# Patient Record
Sex: Female | Born: 1980 | Race: White | Hispanic: No | Marital: Married | Smoking: Never smoker
Health system: Southern US, Community
[De-identification: ages and names within clinical notes are randomized; demographics above are authoritative.]

## PROBLEM LIST (undated history)

## (undated) ENCOUNTER — Inpatient Hospital Stay (HOSPITAL_COMMUNITY): Payer: Self-pay

## (undated) DIAGNOSIS — K219 Gastro-esophageal reflux disease without esophagitis: Secondary | ICD-10-CM

## (undated) DIAGNOSIS — D649 Anemia, unspecified: Secondary | ICD-10-CM

## (undated) DIAGNOSIS — I10 Essential (primary) hypertension: Secondary | ICD-10-CM

## (undated) DIAGNOSIS — E119 Type 2 diabetes mellitus without complications: Secondary | ICD-10-CM

## (undated) DIAGNOSIS — R011 Cardiac murmur, unspecified: Secondary | ICD-10-CM

## (undated) DIAGNOSIS — O24419 Gestational diabetes mellitus in pregnancy, unspecified control: Secondary | ICD-10-CM

## (undated) DIAGNOSIS — O09529 Supervision of elderly multigravida, unspecified trimester: Secondary | ICD-10-CM

## (undated) HISTORY — PX: DILATION AND CURETTAGE OF UTERUS: SHX78

## (undated) HISTORY — DX: Type 2 diabetes mellitus without complications: E11.9

## (undated) HISTORY — DX: Essential (primary) hypertension: I10

## (undated) HISTORY — PX: WISDOM TOOTH EXTRACTION: SHX21

---

## 1998-06-04 HISTORY — PX: FOOT SURGERY: SHX648

## 2001-09-29 ENCOUNTER — Emergency Department (HOSPITAL_COMMUNITY): Admission: EM | Admit: 2001-09-29 | Discharge: 2001-09-29 | Payer: Self-pay | Admitting: Emergency Medicine

## 2007-12-31 ENCOUNTER — Inpatient Hospital Stay (HOSPITAL_COMMUNITY): Admission: AD | Admit: 2007-12-31 | Discharge: 2008-01-02 | Payer: Self-pay | Admitting: Obstetrics and Gynecology

## 2008-01-01 ENCOUNTER — Encounter: Payer: Self-pay | Admitting: Obstetrics and Gynecology

## 2008-01-07 ENCOUNTER — Encounter (HOSPITAL_COMMUNITY): Payer: Self-pay | Admitting: Obstetrics and Gynecology

## 2008-01-07 ENCOUNTER — Inpatient Hospital Stay (HOSPITAL_COMMUNITY): Admission: AD | Admit: 2008-01-07 | Discharge: 2008-01-13 | Payer: Self-pay | Admitting: Obstetrics and Gynecology

## 2008-01-08 ENCOUNTER — Encounter (HOSPITAL_COMMUNITY): Payer: Self-pay | Admitting: Obstetrics and Gynecology

## 2008-01-09 ENCOUNTER — Encounter (HOSPITAL_COMMUNITY): Payer: Self-pay | Admitting: Obstetrics and Gynecology

## 2008-01-12 ENCOUNTER — Encounter (HOSPITAL_COMMUNITY): Payer: Self-pay | Admitting: Obstetrics and Gynecology

## 2008-01-13 ENCOUNTER — Encounter (HOSPITAL_COMMUNITY): Payer: Self-pay | Admitting: Obstetrics and Gynecology

## 2008-01-15 ENCOUNTER — Encounter (INDEPENDENT_AMBULATORY_CARE_PROVIDER_SITE_OTHER): Payer: Self-pay | Admitting: Obstetrics and Gynecology

## 2008-01-15 ENCOUNTER — Encounter: Payer: Self-pay | Admitting: Obstetrics and Gynecology

## 2008-01-15 ENCOUNTER — Inpatient Hospital Stay (HOSPITAL_COMMUNITY): Admission: AD | Admit: 2008-01-15 | Discharge: 2008-01-19 | Payer: Self-pay | Admitting: Obstetrics and Gynecology

## 2008-01-20 ENCOUNTER — Encounter: Admission: RE | Admit: 2008-01-20 | Discharge: 2008-02-19 | Payer: Self-pay | Admitting: Obstetrics and Gynecology

## 2008-02-20 ENCOUNTER — Encounter: Admission: RE | Admit: 2008-02-20 | Discharge: 2008-03-20 | Payer: Self-pay | Admitting: Obstetrics and Gynecology

## 2008-03-21 ENCOUNTER — Encounter: Admission: RE | Admit: 2008-03-21 | Discharge: 2008-03-31 | Payer: Self-pay | Admitting: Obstetrics and Gynecology

## 2010-01-08 IMAGING — US US OB COMP +14 WK
1 series · 14 of 28 positions shown · non-contrast
Comparison: none

OBSTETRICAL ULTRASOUND:
 This ultrasound was performed in The [HOSPITAL], and the [facility] report will be stored to [REDACTED] PACS.

[Series 1: us ob comp +14 wk · 90 acquisitions, 14 frames shown]
[im 4/90]
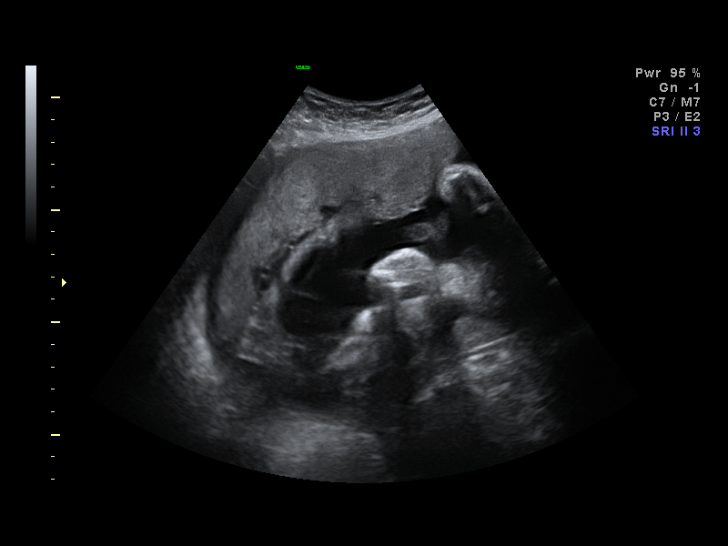
[im 10/90]
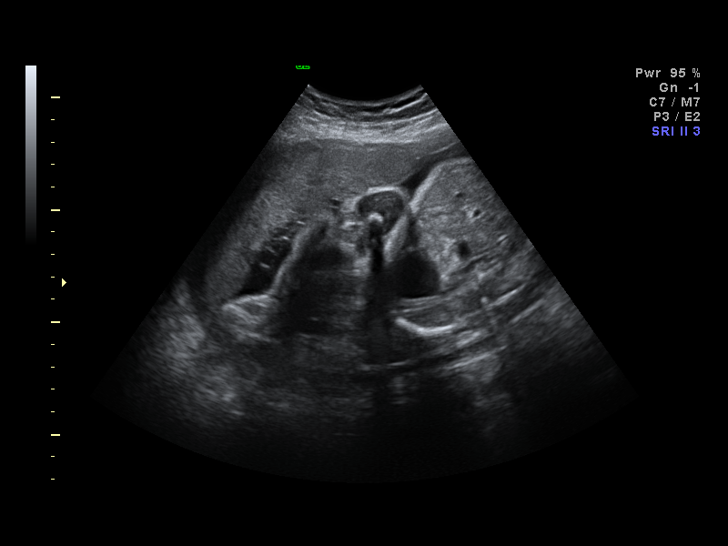
[im 17/90]
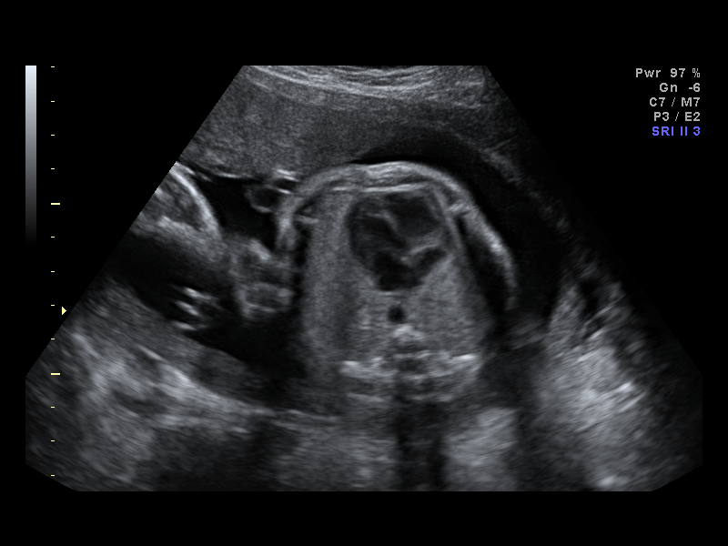
[im 24/90]
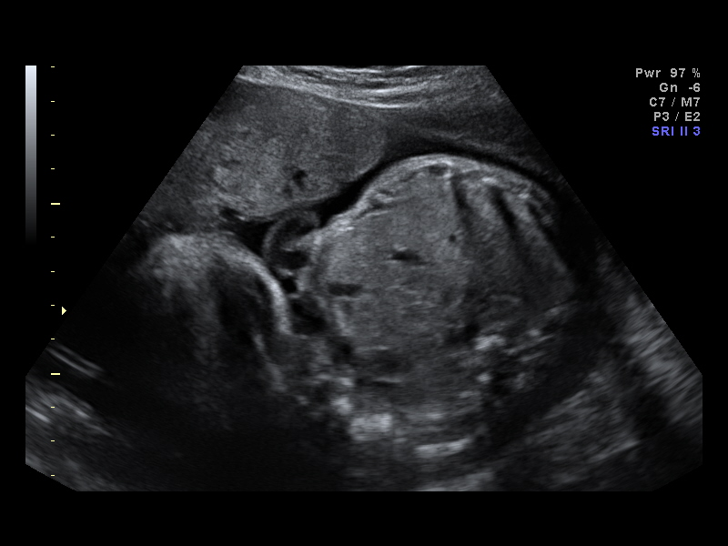
[im 30/90]
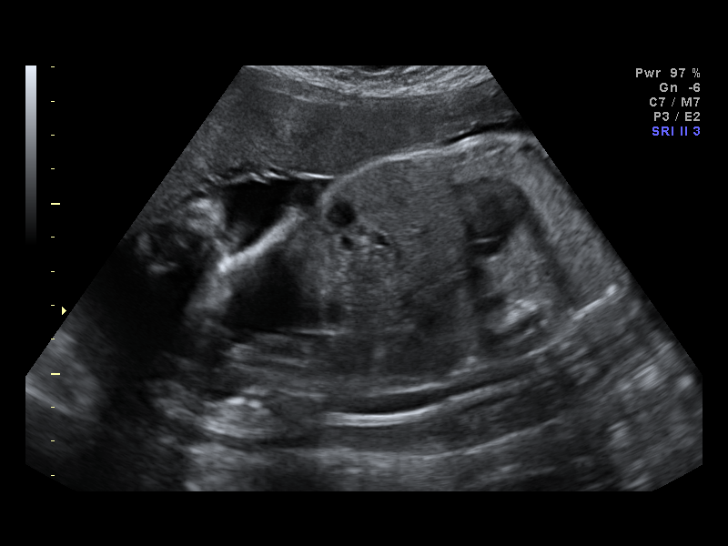
[im 37/90]
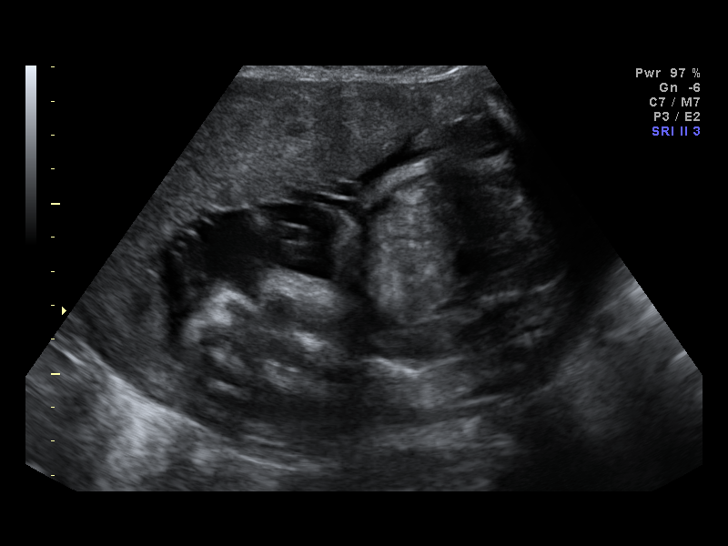
[im 43/90]
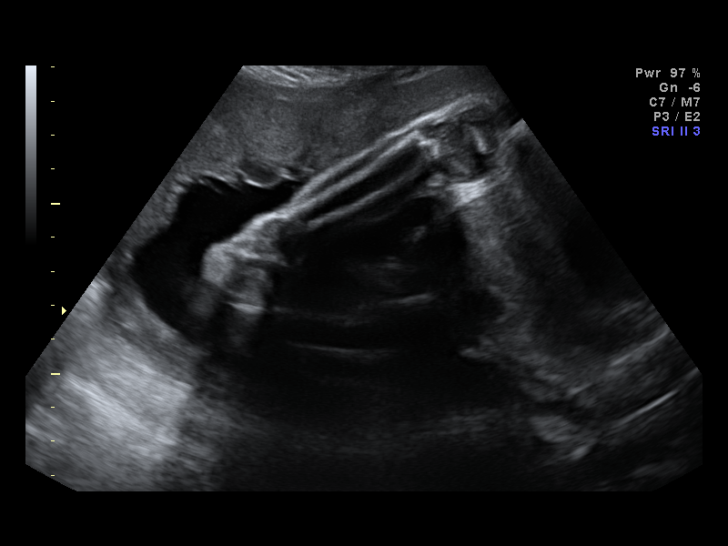
[im 50/90]
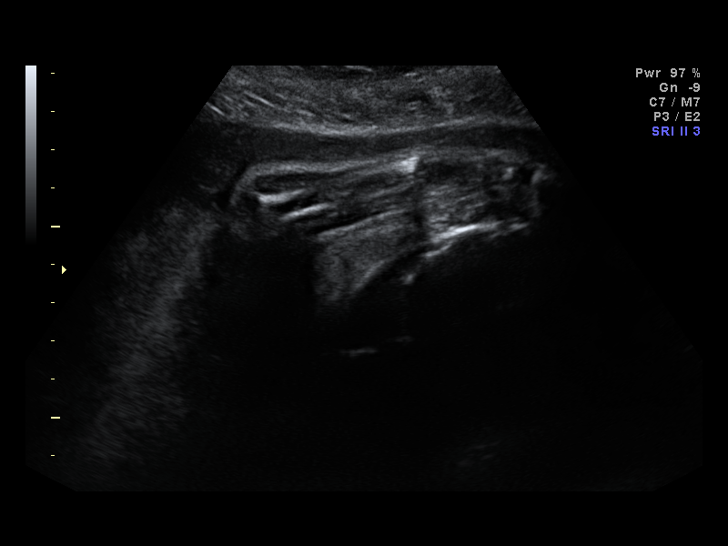
[im 57/90]
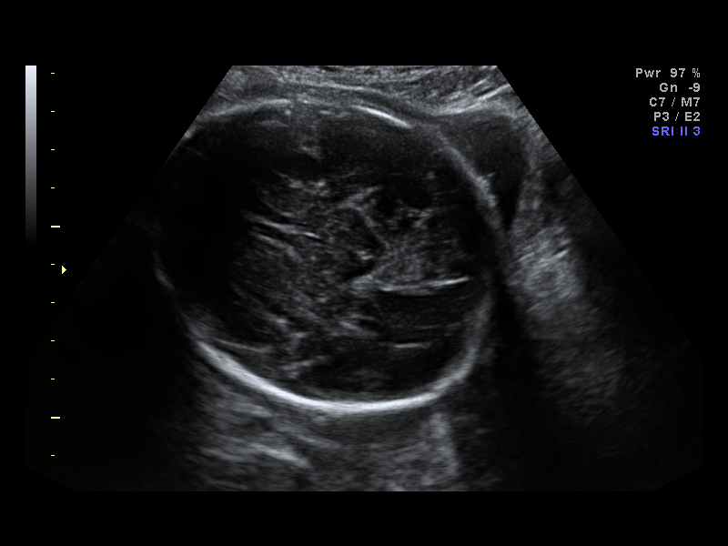
[im 63/90]
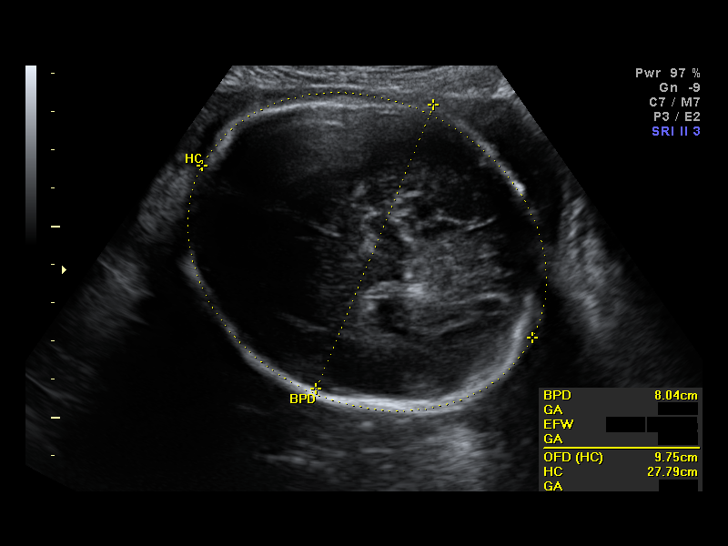
[im 70/90]
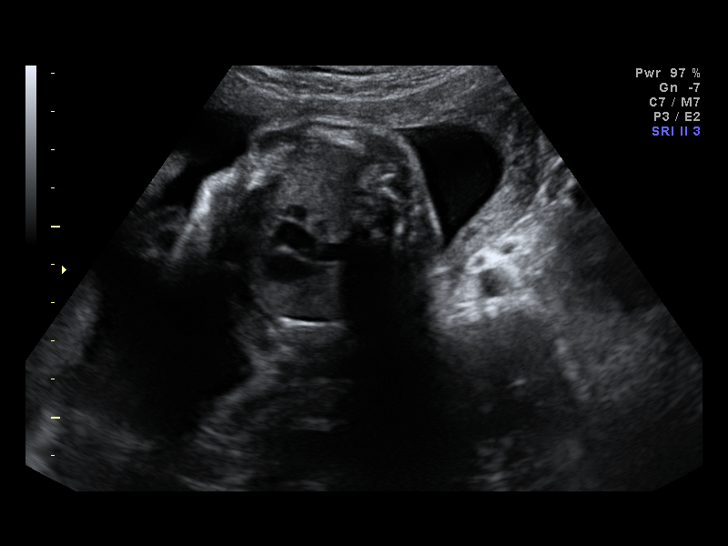
[im 76/90]
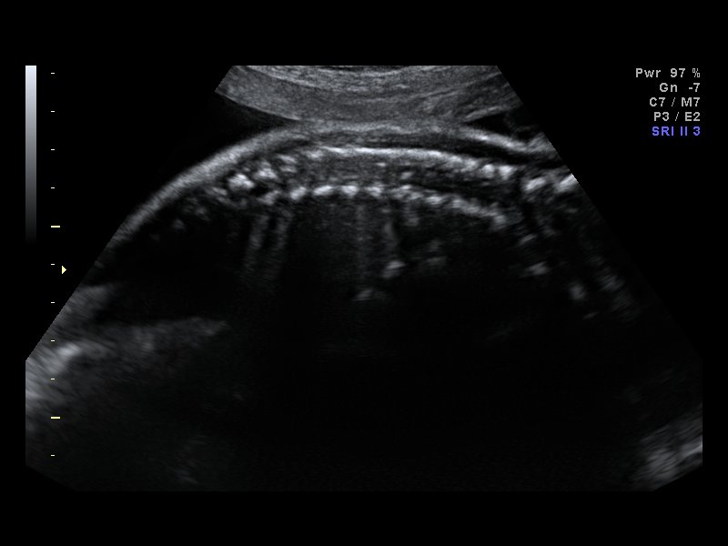
[im 83/90]
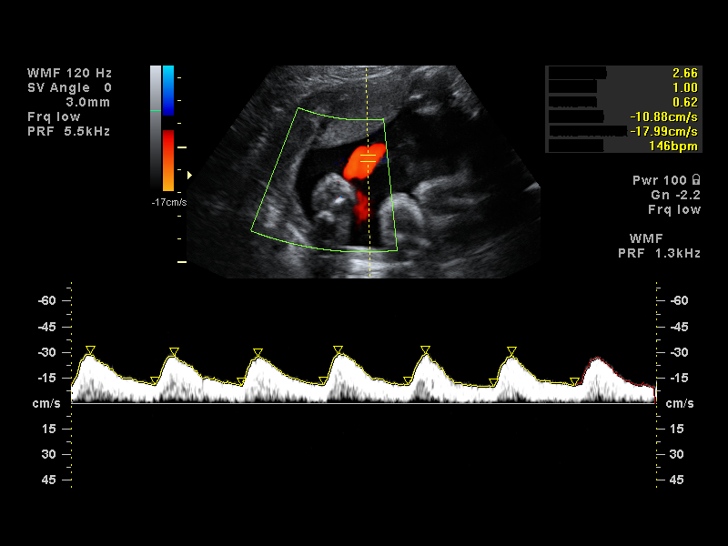
[im 90/90]
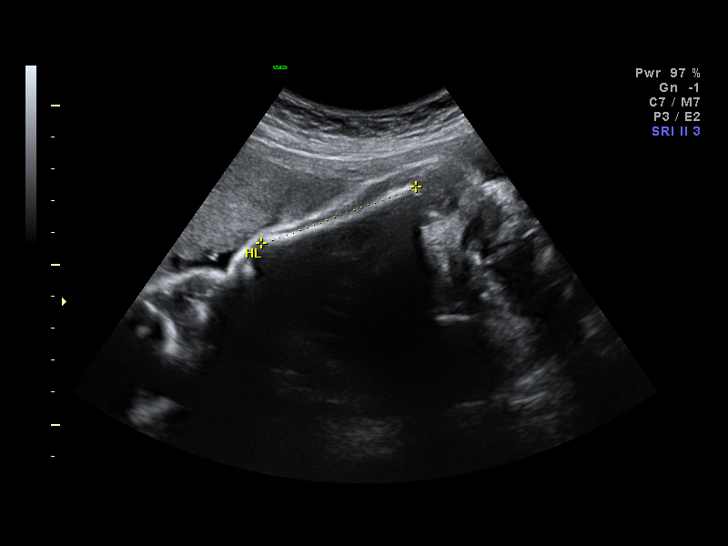

[14 of 28 positions shown; findings below may reference images not displayed]

IMPRESSION: [facility] has also been faxed to the ordering physician.

## 2010-10-17 NOTE — Consult Note (Signed)
NAMESARAE, NICHOLES                ACCOUNT NO.:  [id number]   MEDICAL RECORD NO.:  [id number]          PATIENT TYPE:  OUT   LOCATION:  [facility]FM                           [facility]ILITY:  [facility]   PHYSICIAN:  Toma Copier, MD           DATE OF BIRTH:  1981-03-09   DATE OF CONSULTATION:  01/01/2008  DATE OF DISCHARGE:                                 CONSULTATION   ADDRESS:  Guy Sandifer. Henderson Cloud, M.D.  [address]  [place]  [place], [place] [address]   BODY:  Dear Dr. Henderson Cloud:   Thank you for allowing me to see Anita Hall today for consultation.  As  you know, she is a 30 year old G1 admitted from the office yesterday  after an ultrasound revealed a growth restricted fetus approximately the  seventh percentile.  The patient has been in the hospital since that  time receiving antenatal corticosteroids and undergoing further  evaluation.  She has a past medical history notable for chronic  hypertension which was diagnosed approximately 2 years ago.  She is  otherwise without complaints.  She reports good fetal movement and no  contractions.  No abnormal discharge.  No bleeding.  No headaches,  visual changes or right upper  quadrant pain.   PAST MEDICAL HISTORY:  As mentioned above for chronic hypertension for  approximately 2 years.  No medications required during this pregnancy up  until the beginning of July when she was started on labetalol.  Since  then, this medication has been increased to 200 mg twice a day.   PAST SURGICAL HISTORY:  Notable for foot surgery in the past.   MEDICATIONS:  Labetalol, as mentioned above, and prenatal vitamins.   ALLERGIES:  No known drug allergies.   SOCIAL HISTORY:  The patient denies any tobacco, alcohol or drugs of  abuse.   PAST OB HISTORY:  She is a primigravida.   PAST GYN HISTORY:  The patient denies any abnormal Pap smears or any  STDs.   FAMILY HISTORY:  Noncontributory.   ULTRASOUND FINDINGS:  On ultrasound today, we noticed that the estimated  fetal weight is 1445 grams, which is at the 16th percentile for  gestational age.  The fetus was noted to also have normal amniotic fluid  volume of 15.7 cm and in the vertex presentation.  Local artery Dopplers  were also done given the concern for growth restriction on admission and  are noted to be normal with an S:D ratio at the 69th percentile.  There  is no absent or reverse end diastolic flow noted.  Also, the biophysical  profile was 8/8.   ASSESSMENT AND RECOMMENDATIONS:  Intrauterine pregnancy at 32 weeks and  1 day in a patient with known chronic hypertension.  The patient denies  any symptomatology of severe preeclampsia and states that her blood  pressures were in the mildly elevated range.  I would recommend that she  undergo a 24-hour urine collection and a further evaluation for  preeclampsia.  Given these findings, I would recommend that she complete  her course of antenatal  steroids.  After which point, if her blood  pressures remain stable, she would be a good candidate for outpatient  observation.  The findings on ultrasound revealed that the abdominal  circumference is at the 4th percentile and there may be a component of  asymmetric growth restriction, but is not absolutely growth restricted  at this time.  I would recommend a followup ultrasound evaluation in  approximately 2 to 3 weeks to assess interval growth.  Furthermore,  given her chronic hypertension status and the need for  antihypertensives, I would recommend that she initiate nonstress test  beginning during this hospitalization and upon discharge, and if  discharged, she have this at least twice a week.  These findings were  presented to Dr. Henderson Cloud, who agreed with our plan.  The patient also  was aware of these findings and plan, and all questions were answered.   Thank you very much for allowing me to participate in the care of Ms.  Hall, and if I can be of any further assistance, please feel free  to  contact me.   TOTAL CONSULTATION TIME:  Approximately 40 minutes, of which direct face-  to-face time was greater than 50%.      Toma Copier, MD  Electronically Signed     SJ/MEDQ  D:  01/01/2008  T:  01/01/2008  Job:  [id number]

## 2010-10-17 NOTE — Discharge Summary (Signed)
Anita Hall, Anita Hall                ACCOUNT NO.:  [id number]   MEDICAL RECORD NO.:  [id number]          PATIENT TYPE:  INP   LOCATION:  9305                          [facility]ILITY:  [facility]   PHYSICIAN:  Zelphia Cairo, MD    DATE OF BIRTH:  1981/02/16   DATE OF ADMISSION:  01/15/2008  DATE OF DISCHARGE:  01/19/2008                               DISCHARGE SUMMARY   ADMITTING DIAGNOSES:  1. Intrauterine pregnancy at 34-1/7 weeks estimated gestational age.  2. Chronic hypertension.  3. Intrauterine growth retardation.   DISCHARGE DIAGNOSES:  1. Status post low-transverse cesarean section secondary to non-      reassuring fetal heart tones.  2. Viable female infant.   PROCEDURE:  Low-transverse cesarean section.   REASON FOR ADMISSION:  Please see written H&P.   HOSPITAL COURSE:  The patient is a 30 year old white married female  primigravida who was admitted to [facility] at 34-1/7  weeks estimated gestational age for an induction of labor.  The  patient's pregnancy had been complicated by chronic hypertension and  poor fetal growth.  The patient had been evaluated by Maternal-Fetal  Medicine which had revealed only a 40 g growth of the fetus in  approximately 2 weeks and was also noted to have intermittent lack of  end-diastolic flow, and no reverse end-diastolic flow.  Baby was in the  vertex presentation.  Cervix was noted to be closed and thick.  After  discussion with Dr. Margot Ables, a decision was made to proceed with a two-  stage induction of labor.  Cervidil was placed in the vagina and the  patient did noted to have some mild contractions.  Fetus did respond  with some decelerations with heart rate down into the 50s.  Cervidil was  removed.  Oxygen was administered and IV fluids were given.  Fetal heart  tone did return back to its baseline with good beat-to-beat variability.  However, due to cervix being closed and non-reassuring fetal heart  tones, decision was  made to proceed with a primary low-transverse  cesarean section.  The patient was then transferred to the operating  room where spinal anesthesia was admitted without difficulty.  A low-  transverse incision was made with delivery of a viable female infant with  Apgars of 7 at 1 and 9 at 5 minutes.  Arterial cord pH of 7.27.  The  patient tolerated the procedure well and was taken to the recovery room  in stable condition.  On postoperative day #1, the patient was without  complaint.  Vital signs were stable with blood pressure 120/75.  Abdomen  was soft.  Fundus was firm and nontender.  Abdominal dressing had to be  clean, dry, and intact.  Laboratory findings revealed; hemoglobin of  11.6, platelet count of 151,000,  and WBC count of 13.4.  Liver function  tests were within normal limits.  On postoperative day #2, the patient  was without complaint.  Vital signs remained stable.  Abdomen was soft.  Fundus firm and nontender.  Abdominal dressing had been removed  revealing incision that was clean, dry,  and intact.  On postoperative  day #3, the patient was without complaint.  Vital signs were stable.  She was afebrile.  Fundus firm and nontender.  She is ambulating well,  tolerating a regular diet without complaints of nausea and vomiting.  On  postoperative day #4, the patient was without complaint.  Pain was well  controlled.  She denied any nausea or vomiting.  Vital signs were  stable.  Her blood pressure 124-133 over 82-97.  Hemoglobin was 11.6.  Fundus was firm and nontender.  Incision was clean, dry, and intact.  Staples were removed.  The patient was later discharged home.   CONDITION ON DISCHARGE:  Stable.   DIET:  Regular as tolerated.   ACTIVITY:  No heavy lifting, no driving x2 weeks, and no vaginal entry.   FOLLOWUP:  The patient to follow up in the office in 1 week for an  incision check.  She is to call for temperature greater than 100  degrees, persistent nausea and  vomiting, heavy vaginal bleeding, and/or  redness or drainage of incisional site.   DISCHARGE MEDICATIONS:  1. Percocet 5/325 #30 one p.o. every 4-6 hours p.r.n.  2. Motrin 600 mg every 6 hours.  3. Labetalol 200 mg 1 p.o. b.i.d.      Julio Sicks, N.P.      Zelphia Cairo, MD  Electronically Signed    CC/MEDQ  D:  02/05/2008  T:  02/05/2008  Job:  [id number]

## 2010-10-17 NOTE — Op Note (Signed)
Anita Hall, Anita Hall                ACCOUNT NO.:  [id number]   MEDICAL RECORD NO.:  [id number]          PATIENT TYPE:  INP   LOCATION:  9305                          [facility]   PHYSICIAN:  Guy Sandifer. Henderson Cloud, M.D. DATE OF BIRTH:  04-03-1981   DATE OF PROCEDURE:  01/15/2008  DATE OF DISCHARGE:                               OPERATIVE REPORT   PREOPERATIVE DIAGNOSES:  1. Chronic hypertension.  2. Intrauterine growth retardation.  3. Nonreassuring fetal heart tracing.  4. A 34-1/7 weeks' estimated gestational age.   POSTOPERATIVE DIAGNOSIS:  1. Chronic hypertension.  2. Intrauterine growth retardation.  3. Nonreassuring fetal heart tracing.  4. 34-1/7 weeks' estimated gestational age.   PROCEDURE:  Low-transverse cesarean section.   SURGEON:  Guy Sandifer. Henderson Cloud, MD   ANESTHESIA:  Spinal, Tyrone Apple. Malen Gauze, MD   SPECIMENS:  Placenta to Pathology.   ESTIMATED BLOOD LOSS:  600 mL.   FINDINGS:  Viable female infant, Apgars of 7 and 9 at 1 and 5 minutes  respectively.  Birth weight pending.  Arterial cord pH 7.27.   INDICATIONS AND CONSENT:  This patient is a 30 year old married white  female G1, P0 with an EDC of February 25, 2008, placing her at 34-1/7th  weeks.  Prenatal care has been complicated by chronic hypertension and  poor fetal growth.  Today, followup ultrasound with maternal fetal  medicine revealed only 40 g of growth in 2 weeks as well as intermittent  lack of end diastolic flow.  There was no reverse end diastolic flow.  Baby was vertex.  Cervix was closed and thick.  After discussion with  Dr. Margot Ables, delivery is recommended.  The patient was admitted to labor  and delivery for a two-stage induction.  Cervidil was placed in the  vagina.  The patient had some mild contractions, which resulted in  decelerations with fetal heart rate in the 50s.  The Cervidil was  removed.  Oxygen was given and IV fluids were given.  The fetal heart  rate resuscitated.  Delivery  was recommended.  Cesarean section was  discussed.  All questions were answered and consent is signed on the  chart.   PROCEDURE:  The patient is taken to the operating room where she is  identified.  Spinal anesthetic is placed and she is placed in dorsal  supine position with left lateral wedge.  She is prepped.  Foley  catheter is placed and bladder was drained, and she is draped in a  sterile fashion.  After testing for adequate spinal anesthesia, skin is  entered through a Pfannenstiel incision and dissection is carried out in  layers to the peritoneum.  Peritoneum is incised and extended superiorly  and inferiorly.  Vesicouterine peritoneum is taken down cephalad  laterally.  The bladder flap was developed and bladder blade was placed.  Uterus is incised in low transverse manner and the uterine cavity is  entered bluntly with a hemostat.  The uterine incision was extended  cephalad laterally with fingers.  Artificial rupture of membranes for  clear fluid is carried out.  Baby is then delivered  from the vertex  presentation without difficulty.  Cord is clamped and cut.  The baby is  handed to the waiting pediatrics team.  Placenta is manually delivered  and sent to pathology.  Uterus is clean.  Uterus is closed in two  running locking imbricating layers of 0 Monocryl suture, which achieves  good hemostasis.  Tubes and ovaries normal bilaterally.  Anterior  peritoneum is closed running fashion which is also used to reapproximate  the pyramidalis muscle in the midline.  A single figure-of-eight 2-0  chromic suture is used on the rectus to obtain complete hemostasis.  Anterior rectus fascia is closed in running fashion with 0 PDS looped  suture.  Skin is closed with clips.  All sponge, instrument, and needle  counts are correct.  The patient is transferred to recovery room in  stable condition.      Guy Sandifer Henderson Cloud, M.D.  Electronically Signed     JET/MEDQ  D:  01/15/2008  T:   01/16/2008  Job:  [phone] [id number]

## 2010-10-17 NOTE — H&P (Signed)
[name], [name]                ACCOUNT NO.:  [id number]   MEDICAL RECORD NO.:  [id number]          PATIENT TYPE:  INP   LOCATION:  9176                          [facility]   PHYSICIAN:  Juluis Mire, M.D.   DATE OF BIRTH:  13-Jun-1980   DATE OF ADMISSION:  12/31/2007  DATE OF DISCHARGE:                              HISTORY & PHYSICAL   The patient is a 30 year old primigravida female, last menstrual period  on May 21, 2007, given her estimated date of confinement of  February 25, 2008, and an estimated gestational age of [redacted] weeks.  Her  prenatal course has been complicated by hypertension.  She had been on  hydrochlorothiazide prepregnancy.  She has now been on labetalol for  management of blood pressure.  She was seen in the office today.  Ultrasound revealed estimated fetal weight of 2 pounds and 13 ounces  with the 6th percentile consistent with symmetrical IUGR.  Amniotic  fluid was 13 cm, which is at 37th percentile.  Umbilical artery Dopplers  were normal.  She did have a nonstress test, which did appear reactive.  In view of the IUGR and in view of the hypertension, the patient is  going to be admitted to receive steroids and further monitoring and  management.  We will probably repeat an ultrasound in the next few days  to reassess the Dopplers and do a biophysical profile.   ALLERGIES:  No known drug allergies.   MEDICATIONS:  She is on labetalol 100 mg twice a day as well as prenatal  vitamins.   PAST MEDICAL HISTORY, FAMILY HISTORY, AND SOCIAL HISTORY:  Please see  prenatal records.   REVIEW OF SYSTEMS:  Noncontributory.   PHYSICAL EXAMINATION:  GENERAL:  The patient is afebrile.  VITAL SIGNS:  Blood pressure is 140/88, other vital signs are stable.  LUNGS:  Clear.  CARDIOVASCULAR:  Regular rhythm and rate, and grade 2/6 systolic  ejection murmur.  No clicks or gallop.  ABDOMEN:  Gravid uterus consistent with dates.  PELVIC:  Not performed.  EXTREMITIES:  Trace edema.  NEUROLOGICAL:  Grossly within normal limits.  Deep tendon reflexes are  2+ and no clonus.   IMPRESSION:  1. Intrauterine pregnancy at 32 weeks with chronic hypertension.  2. Intrauterine growth retardation.   PLAN:  The patient will be brought in for a receiving steroid.  We will  do further monitoring and blood work including PIH panel.  We will  probably repeat the ultrasound with Dopplers in the next day or so.      Juluis Mire, M.D.  Electronically Signed     JSM/MEDQ  D:  12/31/2007  T:  01/01/2008  Job:  [id number]

## 2010-10-17 NOTE — H&P (Signed)
NAMEJONNIE, KUBLY                ACCOUNT NO.:  [id number]   MEDICAL RECORD NO.:  [id number]          PATIENT TYPE:  INP   LOCATION:  9156                          [facility]   PHYSICIAN:  Zelphia Cairo, MD    DATE OF BIRTH:  1981-01-12   DATE OF ADMISSION:  01/07/2008  DATE OF DISCHARGE:                              HISTORY & PHYSICAL   Lasundra is a 30 year old G1, P0 at [redacted] weeks gestation who presented to the  office for a followup NST.  Khori has chronic hypertension and her blood  pressures have been 130-140 over 88-90.  She takes labetalol 200 mg  b.i.d.  She was admitted last week after an ultrasound in our office  showed an estimated fetal weight of 1266 grams which by our charts was  the 6th percentile.  Umbilical artery Dopplers and AFI were normal.  The  patient was admitted over the weekend, given betamethasone.  A followup  ultrasound done in the hospital during the maternal fetal medicine  consult showed estimated fetal weight at the 20th percentile with normal  AFI and normal Dopplers.  Plan was made to start a 24 hour urine and  discharge home if this was normal.  The 24 hour urine returned normal  and the patient was discharged home to follow up in our office with  twice weekly NSTs and weekly Dopplers and AFIs.  The patient presented  today for NST.  NST was flat.  BPP was noted to be 8/8, AFI was 12.1 (33  percentile).  Umbilical cord Doppler showed no end diastolic flow.  MCA  Doppler showed a pulsatile index of 1.12.   Bonna denies any complaints of headaches, visual changes or nausea or  vomiting.  She denies any contractions, vaginal bleeding or leaking of  fluid.   PAST MEDICAL HISTORY:  Chronic hypertension.   PAST SURGICAL HISTORY:  Foot surgery, wisdom teeth extraction.   ALLERGIES:  None.   MEDICATIONS:  1. Labetalol 200 mg b.i.d.  2. Prenatal vitamin daily.   GYN HISTORY:  Is negative for abnormal Pap smears.  No cervical  procedures.  No  history of sexually transmitted infections.   FAMILY HISTORY:  Significant for diabetes and hypertension.   PHYSICAL EXAM:  VITAL SIGNS:  Blood pressure 138/90, weight 145.  GENERAL:  She is in no acute distress.  ABDOMEN:  Abdomen is gravid and nontender.  Cervix is closed, thick and  high, soft.  EXTREMITIES:  Without clubbing, cyanosis or edema.  NEUROLOGICAL:  Exam is grossly intact.  Deep tendon reflexes 2+  bilaterally.  No clonus.   ASSESSMENT AND PLAN:  A 30 year old G1, P0 with questionable IUGR and no  end diastolic flow by umbilical cord Dopplers.  I recommended admission  to the antenatal unit for continuous fetal monitoring.  We will also  repeat  preeclampsia labs and start a 24 urine.  The case was discussed with Dr.  Katrinka Blazing, who recommended daily Dopplers and continuous monitoring.  She  will consult on Ms. Hesler for any further recommendations.  We will  continue her labetalol twice  daily for blood pressure control.      Zelphia Cairo, MD  Electronically Signed     GA/MEDQ  D:  01/07/2008  T:  01/07/2008  Job:  [id number]

## 2011-03-02 LAB — COMPREHENSIVE METABOLIC PANEL
ALT: 36 — ABNORMAL HIGH
AST: 35
AST: 33
AST: 35
Albumin: 3.1 — ABNORMAL LOW
Albumin: 2.7 — ABNORMAL LOW
Albumin: 3.1 — ABNORMAL LOW
Alkaline Phosphatase: 70
Alkaline Phosphatase: 83
BUN: 9
BUN: 13
CO2: 23
CO2: 23
CO2: 26
Calcium: 9.4
Calcium: 9.7
Chloride: 103
Chloride: 105
Creatinine, Ser: 0.5
Creatinine, Ser: 0.52
GFR calc Af Amer: >60
GFR calc non Af Amer: >60
GFR calc non Af Amer: >60
GFR calc non Af Amer: >60
Glucose, Bld: 80
Potassium: 3.8
Potassium: 3.6
Sodium: 136
Total Bilirubin: 0.1 — ABNORMAL LOW
Total Bilirubin: 0.3
Total Protein: 6.9

## 2011-03-02 LAB — LACTATE DEHYDROGENASE
LDH: 148
LDH: 143

## 2011-03-02 LAB — CBC
Hemoglobin: 12.2
Hemoglobin: 12.4
MCHC: 34.1
MCHC: 33.8
MCHC: 34
MCV: 96.9
MCV: 97
MCV: 97.3
Platelets: 192
Platelets: 151
Platelets: 168
RBC: 3.68 — ABNORMAL LOW
RBC: 3.68 — ABNORMAL LOW
RBC: 3.76 — ABNORMAL LOW
RDW: 12.5
RDW: 12.7
RDW: 12.9
WBC: 13 — ABNORMAL HIGH
WBC: 19.1 — ABNORMAL HIGH

## 2011-03-02 LAB — LSPG (L/S RATIO WITH PG)-AMNIO FLUID

## 2011-03-02 LAB — PROTEIN, URINE, 24 HOUR
Collection Interval-UPROT: 24
Protein, 24H Urine: 140 — ABNORMAL HIGH
Protein, Urine: 7
Urine Total Volume-UPROT: 1750

## 2011-03-02 LAB — CREATININE CLEARANCE, URINE, 24 HOUR
Collection Interval-CRCL: 24
Urine Total Volume-CRCL: 1750

## 2011-03-02 LAB — STREP B DNA PROBE

## 2011-03-02 LAB — URIC ACID
Uric Acid, Serum: 5.7
Uric Acid, Serum: 5.5
Uric Acid, Serum: 6.1

## 2012-01-25 LAB — OB RESULTS CONSOLE HIV ANTIBODY (ROUTINE TESTING): HIV: NONREACTIVE

## 2012-01-25 LAB — OB RESULTS CONSOLE HEPATITIS B SURFACE ANTIGEN: Hepatitis B Surface Ag: NEGATIVE

## 2012-01-25 LAB — OB RESULTS CONSOLE RPR: RPR: NONREACTIVE

## 2012-01-25 LAB — OB RESULTS CONSOLE ABO/RH: RH Type: POSITIVE

## 2012-06-25 ENCOUNTER — Encounter: Payer: 59 | Attending: Obstetrics and Gynecology | Admitting: *Deleted

## 2012-06-25 ENCOUNTER — Encounter: Payer: Self-pay | Admitting: *Deleted

## 2012-06-25 DIAGNOSIS — O99810 Abnormal glucose complicating pregnancy: Secondary | ICD-10-CM | POA: Insufficient documentation

## 2012-06-25 DIAGNOSIS — Z713 Dietary counseling and surveillance: Secondary | ICD-10-CM | POA: Insufficient documentation

## 2012-06-25 NOTE — Patient Instructions (Signed)
Goals:  Check glucose levels per MD as instructed  Follow Gestational Diabetes Diet as instructed  Call for follow-up as needed    

## 2012-06-25 NOTE — Progress Notes (Signed)
  Patient was seen on 06/25/2012 for Gestational Diabetes self-management class at the [facility]. The following learning objectives were met by the patient during this course:   States the definition of Gestational Diabetes  States why dietary management is important in controlling blood glucose  Describes the effects each nutrient has on blood glucose levels  Demonstrates ability to create a balanced meal plan  Demonstrates carbohydrate counting   States when to check blood glucose levels  Demonstrates proper blood glucose monitoring techniques  States the effect of stress and exercise on blood glucose levels  States the importance of limiting caffeine and abstaining from alcohol and smoking  Blood glucose monitor given: Blood glucose monitor given: Contour Next EZ Blood Glucose Kit Lot # [id number] Exp: 07/2013 Blood glucose reading: 85 mg/dl  Patient instructed to monitor glucose levels: FBS: 60 - <90 2 hour: <120  *Patient received handouts:  Nutrition Diabetes and Pregnancy  Carbohydrate Counting List  Patient will be seen for follow-up as needed.

## 2012-08-01 ENCOUNTER — Inpatient Hospital Stay (HOSPITAL_COMMUNITY): Payer: 59

## 2012-08-01 ENCOUNTER — Inpatient Hospital Stay (HOSPITAL_COMMUNITY)
Admission: AD | Admit: 2012-08-01 | Discharge: 2012-08-01 | Disposition: A | Discharge status: Home / Self Care | Payer: 59 | Source: Ambulatory Visit | Attending: Obstetrics and Gynecology | Admitting: Obstetrics and Gynecology

## 2012-08-01 ENCOUNTER — Encounter (HOSPITAL_COMMUNITY): Payer: Self-pay | Admitting: *Deleted

## 2012-08-01 DIAGNOSIS — O99891 Other specified diseases and conditions complicating pregnancy: Secondary | ICD-10-CM | POA: Insufficient documentation

## 2012-08-01 DIAGNOSIS — R030 Elevated blood-pressure reading, without diagnosis of hypertension: Secondary | ICD-10-CM | POA: Insufficient documentation

## 2012-08-01 LAB — URIC ACID: Uric Acid, Serum: 4.8 mg/dL (ref 2.4–7.0)

## 2012-08-01 LAB — CBC
MCH: 30.8 pg (ref 26.0–34.0)
Platelets: 271 10*3/uL (ref 150–400)
RBC: 3.96 MIL/uL (ref 3.87–5.11)

## 2012-08-01 LAB — COMPREHENSIVE METABOLIC PANEL
Albumin: 3.1 g/dL — ABNORMAL LOW (ref 3.5–5.2)
Alkaline Phosphatase: 147 U/L — ABNORMAL HIGH (ref 39–117)
BUN: 8 mg/dL (ref 6–23)
Creatinine, Ser: 0.57 mg/dL (ref 0.50–1.10)
Potassium: 3.6 mEq/L (ref 3.5–5.1)
Total Protein: 7.3 g/dL (ref 6.0–8.3)

## 2012-08-01 LAB — LACTATE DEHYDROGENASE: LDH: 191 U/L (ref 94–250)

## 2012-08-01 MED ORDER — ACETAMINOPHEN 500 MG PO TABS
1000.0000 mg | ORAL_TABLET | Freq: Once | ORAL | Status: AC
  Administered 2012-08-01: 1000 mg via ORAL
  Filled 2012-08-01: qty 2

## 2012-08-01 NOTE — MAU Note (Signed)
Sent from office for eval.  BP has been going up, but today is the highest it has been.  Had a headache earlier, comes and goes past 2 days.  Denies epigastric pain, visual changes or increase in swelling.

## 2012-08-01 NOTE — MAU Note (Signed)
Returned from [place], BPP 5/8, 2 off for lack of breathing movements.

## 2012-08-11 ENCOUNTER — Encounter (HOSPITAL_COMMUNITY): Payer: Self-pay | Admitting: *Deleted

## 2012-08-11 ENCOUNTER — Inpatient Hospital Stay (HOSPITAL_COMMUNITY)
Admission: AD | Admit: 2012-08-11 | Discharge: 2012-08-11 | Disposition: A | Discharge status: Home / Self Care | Payer: 59 | Source: Ambulatory Visit | Attending: Obstetrics and Gynecology | Admitting: Obstetrics and Gynecology

## 2012-08-11 ENCOUNTER — Encounter (HOSPITAL_COMMUNITY): Payer: Self-pay

## 2012-08-11 DIAGNOSIS — O10913 Unspecified pre-existing hypertension complicating pregnancy, third trimester: Secondary | ICD-10-CM

## 2012-08-11 DIAGNOSIS — O1002 Pre-existing essential hypertension complicating childbirth: Secondary | ICD-10-CM

## 2012-08-11 DIAGNOSIS — O26893 Other specified pregnancy related conditions, third trimester: Secondary | ICD-10-CM

## 2012-08-11 DIAGNOSIS — O10019 Pre-existing essential hypertension complicating pregnancy, unspecified trimester: Secondary | ICD-10-CM | POA: Insufficient documentation

## 2012-08-11 DIAGNOSIS — R51 Headache: Secondary | ICD-10-CM | POA: Insufficient documentation

## 2012-08-11 LAB — CBC
Platelets: 264 10*3/uL (ref 150–400)
RBC: 3.95 MIL/uL (ref 3.87–5.11)
WBC: 13.5 10*3/uL — ABNORMAL HIGH (ref 4.0–10.5)

## 2012-08-11 LAB — COMPREHENSIVE METABOLIC PANEL
ALT: 26 U/L (ref 0–35)
AST: 27 U/L (ref 0–37)
Alkaline Phosphatase: 162 U/L — ABNORMAL HIGH (ref 39–117)
CO2: 22 mEq/L (ref 19–32)
Chloride: 99 mEq/L (ref 96–112)
GFR calc non Af Amer: >90 mL/min (ref >90)
Sodium: 133 mEq/L — ABNORMAL LOW (ref 135–145)
Total Bilirubin: 0.2 mg/dL — ABNORMAL LOW (ref 0.3–1.2)

## 2012-08-11 LAB — PROTEIN / CREATININE RATIO, URINE
Creatinine, Urine: 40.23 mg/dL
Total Protein, Urine: 6.8 mg/dL

## 2012-08-11 LAB — URINALYSIS, ROUTINE W REFLEX MICROSCOPIC
Glucose, UA: NEGATIVE mg/dL
Hgb urine dipstick: NEGATIVE
Ketones, ur: NEGATIVE mg/dL
Protein, ur: NEGATIVE mg/dL

## 2012-08-11 MED ORDER — OXYCODONE-ACETAMINOPHEN 5-325 MG PO TABS
1.0000 | ORAL_TABLET | ORAL | Status: AC
  Administered 2012-08-11: 1 via ORAL
  Filled 2012-08-11: qty 1

## 2012-08-11 NOTE — MAU Provider Note (Signed)
Chief Complaint:  Hypertension   First Provider Initiated Contact with Patient 08/11/12 1833      HPI: Anita Hall is a 32 y.o. G2P0101 at [redacted]w[redacted]d who presents to maternity admissions sent from office with elevated BP and h/a.  She reports onset of h/a today, described as sharp and constant, and she has not taken anything for the h/a.  She denies epigastric pain or visual disturbances.  She reports less fetal movement in last 2 days than usual but has felt movement in MAU, denies LOF, vaginal bleeding, vaginal itching/burning, urinary symptoms, dizziness, n/v, or fever/chills.     Past Medical History: Past Medical History  Diagnosis Date  . Hypertension   . Diabetes mellitus without complication     Past obstetric history: OB History   Grav Para Term Preterm Abortions TAB SAB Ect Mult Living   2 1  1      1      # Outc Date GA Lbr Len/2nd Wgt Sex Del Anes PTL Lv   1 PRE            2 CUR               Past Surgical History: Past Surgical History  Procedure Laterality Date  . Cesarean section    . Foot surgery  2000  . Wisdom tooth extraction      Family History: Family History  Problem Relation Age of Onset  . Hypertension Maternal Grandmother   . Diabetes Maternal Grandmother   . Cancer Maternal Grandmother   . Hypertension Maternal Grandfather   . Heart disease Maternal Grandfather   . Hypertension Paternal Grandmother     Social History: History  Substance Use Topics  . Smoking status: Never Smoker   . Smokeless tobacco: Not on file  . Alcohol Use: No     Comment: not while pregnant    Allergies: No Known Allergies  Meds:  Prescriptions prior to admission  Medication Sig Dispense Refill  . acetaminophen (TYLENOL) 500 MG tablet Take 1,000 mg by mouth every 6 (six) hours as needed for pain (For headahce.).      Marland Kitchen glyBURIDE (DIABETA) 2.5 MG tablet Take 2.5 mg by mouth at bedtime.      Marland Kitchen labetalol (NORMODYNE) 100 MG tablet Take 100 mg by mouth 2 (two) times  daily.      . Prenatal Vit-Fe Fumarate-FA (PRENATAL MULTIVITAMIN) TABS Take 1 tablet by mouth daily at 12 noon.      . ranitidine (ZANTAC) 75 MG tablet Take 150 mg by mouth at bedtime as needed.         ROS: Pertinent findings in history of present illness.  Physical Exam  Blood pressure 143/85, pulse 84, temperature 98.2 F (36.8 C), temperature source Oral, resp. rate 16, height 5' (1.524 m), weight 72.666 kg (160 lb 3.2 oz), SpO2 100.00%. Patient Vitals for the past 24 hrs:  BP Temp Temp src Pulse Resp SpO2 Height Weight  08/11/12 2000 140/91 mmHg - - - - - - -  08/11/12 1937 131/84 mmHg - - - - - - -  08/11/12 1914 141/89 mmHg - - 91 - - - -  08/11/12 1909 125/86 mmHg - - 98 - - - -  08/11/12 1811 143/85 mmHg - - 84 16 - - -  08/11/12 1740 144/99 mmHg 98.2 F (36.8 C) Oral 92 16 100 % 5' (1.524 m) 72.666 kg (160 lb 3.2 oz)   GENERAL: Well-developed, well-nourished female in no acute  distress.  HEENT: normocephalic HEART: normal rate RESP: normal effort ABDOMEN: Soft, non-tender, gravid appropriate for gestational age EXTREMITIES: Nontender, no edema NEURO: alert and oriented SPECULUM EXAM: Deferred     FHT:  Baseline 145, moderate variability, accelerations present, no decelerations Contractions: q 4-5 mins   Labs: Results for orders placed during the hospital encounter of 08/11/12 (from the past 24 hour(s))  URINALYSIS, ROUTINE W REFLEX MICROSCOPIC     Status: None   Collection Time    08/11/12  5:45 PM      Result Value Range   Color, Urine YELLOW  YELLOW   APPearance CLEAR  CLEAR   Specific Gravity, Urine 1.015  1.005 - 1.030   pH 6.5  5.0 - 8.0   Glucose, UA NEGATIVE  NEGATIVE mg/dL   Hgb urine dipstick NEGATIVE  NEGATIVE   Bilirubin Urine NEGATIVE  NEGATIVE   Ketones, ur NEGATIVE  NEGATIVE mg/dL   Protein, ur NEGATIVE  NEGATIVE mg/dL   Urobilinogen, UA 0.2  0.0 - 1.0 mg/dL   Nitrite NEGATIVE  NEGATIVE   Leukocytes, UA NEGATIVE  NEGATIVE  CBC     Status:  Abnormal   Collection Time    08/11/12  5:55 PM      Result Value Range   WBC 13.5 (*) 4.0 - 10.5 K/uL   RBC 3.95  3.87 - 5.11 MIL/uL   Hemoglobin 12.2  12.0 - 15.0 g/dL   HCT 16.1  09.6 - 04.5 %   MCV 91.1  78.0 - 100.0 fL   MCH 30.9  26.0 - 34.0 pg   MCHC 33.9  30.0 - 36.0 g/dL   RDW 40.9  81.1 - 91.4 %   Platelets 264  150 - 400 K/uL  COMPREHENSIVE METABOLIC PANEL     Status: Abnormal   Collection Time    08/11/12  5:55 PM      Result Value Range   Sodium 133 (*) 135 - 145 mEq/L   Potassium 3.5  3.5 - 5.1 mEq/L   Chloride 99  96 - 112 mEq/L   CO2 22  19 - 32 mEq/L   Glucose, Bld 81  70 - 99 mg/dL   BUN 13  6 - 23 mg/dL   Creatinine, Ser 7.82  0.50 - 1.10 mg/dL   Calcium 9.9  8.4 - 95.6 mg/dL   Total Protein 7.4  6.0 - 8.3 g/dL   Albumin 3.1 (*) 3.5 - 5.2 g/dL   AST 27  0 - 37 U/L   ALT 26  0 - 35 U/L   Alkaline Phosphatase 162 (*) 39 - 117 U/L   Total Bilirubin 0.2 (*) 0.3 - 1.2 mg/dL   GFR calc non Af Amer >90  >90 mL/min   GFR calc Af Amer >90  >90 mL/min  URIC ACID     Status: None   Collection Time    08/11/12  5:55 PM      Result Value Range   Uric Acid, Serum 4.8  2.4 - 7.0 mg/dL    Assessment: 1. Chronic hypertension in pregnancy, third trimester   2. Headache in pregnancy, antepartum, third trimester     Plan: Called Dr Marcelle Overlie to discuss assessment and findings Percocet x1 tab in MAU--pt reports slight improvement in h/a Called Dr Marcelle Overlie to report response to pain medication and continued borderline BPs Percocet x1 additional tab in MAU RN to call Dr Marcelle Overlie in 1 hour with response   Report to [facility], NP at 8:15 pm  Sharen Counter Certified Nurse-Midwife  08/11/2012 6:49 PM Dr. Marcelle Overlie notified that the patient is feeling better and he request she be discharged home to follow up in the office Instructions given to the patient regarding Labor and elevated blood pressure in pregnancy.

## 2012-08-11 NOTE — MAU Note (Signed)
Patient states she was sent from the office today for elevated blood pressure. Patient reports headaches, no contractions, leaking or bleeding and reports  fetal movement but not as much as usual.

## 2012-08-12 ENCOUNTER — Encounter (HOSPITAL_COMMUNITY)
Admission: RE | Admit: 2012-08-12 | Discharge: 2012-08-12 | Disposition: A | Discharge status: Home / Self Care | Payer: 59 | Source: Ambulatory Visit | Attending: Obstetrics and Gynecology | Admitting: Obstetrics and Gynecology

## 2012-08-12 ENCOUNTER — Encounter (HOSPITAL_COMMUNITY): Payer: Self-pay

## 2012-08-12 LAB — TYPE AND SCREEN

## 2012-08-12 LAB — CBC
HCT: 35 % — ABNORMAL LOW (ref 36.0–46.0)
MCHC: 33.4 g/dL (ref 30.0–36.0)
MCV: 91.6 fL (ref 78.0–100.0)
RDW: 13.4 % (ref 11.5–15.5)
WBC: 12.3 10*3/uL — ABNORMAL HIGH (ref 4.0–10.5)

## 2012-08-12 LAB — BASIC METABOLIC PANEL
BUN: 14 mg/dL (ref 6–23)
Chloride: 100 mEq/L (ref 96–112)
Creatinine, Ser: 0.63 mg/dL (ref 0.50–1.10)
GFR calc Af Amer: >90 mL/min (ref >90)

## 2012-08-12 NOTE — Patient Instructions (Addendum)
20 Anita Hall  08/12/2012   Your procedure is scheduled on:  08/13/12  Enter through the Main Entrance of [facility] at 1130 AM.  Pick up the phone at the desk and dial [phone].   Call this number if you have problems the morning of surgery: [phone]   Remember:   Do not eat food:After Midnight.  Do not drink clear liquids: 4 Hours before arrival.  Take these medicines the morning of surgery with A SIP OF WATER: Lobatalol, hold diabetic medicine for 24hrs prior to surgery   Do not wear jewelry, make-up or nail polish.  Do not wear lotions, powders, or perfumes. You may wear deodorant.  Do not shave 48 hours prior to surgery.  Do not bring valuables to the hospital.  Contacts, dentures or bridgework may not be worn into surgery.  Leave suitcase in the car. After surgery it may be brought to your room.  For patients admitted to the hospital, checkout time is 11:00 AM the day of discharge.   Patients discharged the day of surgery will not be allowed to drive home.  Name and phone number of your driver: NA  Special Instructions: Shower using CHG 2 nights before surgery and the night before surgery.  If you shower the day of surgery use CHG.  Use special wash - you have one bottle of CHG for all showers.  You should use approximately 1/3 of the bottle for each shower.   Please read over the following fact sheets that you were given: Surgical Site Infection Prevention

## 2012-08-13 ENCOUNTER — Inpatient Hospital Stay (HOSPITAL_COMMUNITY): Payer: 59 | Admitting: Anesthesiology

## 2012-08-13 ENCOUNTER — Encounter (HOSPITAL_COMMUNITY): Payer: Self-pay | Admitting: Anesthesiology

## 2012-08-13 ENCOUNTER — Inpatient Hospital Stay (HOSPITAL_COMMUNITY)
Admission: AD | Admit: 2012-08-13 | Discharge: 2012-08-16 | DRG: 765 | Disposition: A | Discharge status: Home / Self Care | Payer: 59 | Source: Ambulatory Visit | Attending: Obstetrics and Gynecology | Admitting: Obstetrics and Gynecology

## 2012-08-13 ENCOUNTER — Encounter (HOSPITAL_COMMUNITY)
Admission: AD | Disposition: A | Discharge status: Home / Self Care | Payer: Self-pay | Source: Ambulatory Visit | Attending: Obstetrics and Gynecology

## 2012-08-13 DIAGNOSIS — O24419 Gestational diabetes mellitus in pregnancy, unspecified control: Secondary | ICD-10-CM | POA: Diagnosis present

## 2012-08-13 DIAGNOSIS — O365990 Maternal care for other known or suspected poor fetal growth, unspecified trimester, not applicable or unspecified: Secondary | ICD-10-CM | POA: Diagnosis present

## 2012-08-13 DIAGNOSIS — Z98891 History of uterine scar from previous surgery: Secondary | ICD-10-CM | POA: Clinically undetermined

## 2012-08-13 DIAGNOSIS — O1002 Pre-existing essential hypertension complicating childbirth: Principal | ICD-10-CM | POA: Diagnosis present

## 2012-08-13 DIAGNOSIS — O99814 Abnormal glucose complicating childbirth: Secondary | ICD-10-CM | POA: Diagnosis present

## 2012-08-13 SURGERY — Surgical Case
Anesthesia: Spinal | Site: Abdomen | Wound class: Clean Contaminated

## 2012-08-13 MED ORDER — DIBUCAINE 1 % RE OINT: 1.0000 "application " | TOPICAL_OINTMENT | RECTAL | Status: DC | PRN

## 2012-08-13 MED ORDER — TETANUS-DIPHTH-ACELL PERTUSSIS 5-2.5-18.5 LF-MCG/0.5 IM SUSP
0.5000 mL | Freq: Once | INTRAMUSCULAR | Status: DC
  Filled 2012-08-13: qty 0.5

## 2012-08-13 MED ORDER — BISACODYL 10 MG RE SUPP: 10.0000 mg | Freq: Every day | RECTAL | Status: DC | PRN

## 2012-08-13 MED ORDER — ONDANSETRON HCL 4 MG/2ML IJ SOLN: 4.0000 mg | Freq: Three times a day (TID) | INTRAMUSCULAR | Status: DC | PRN

## 2012-08-13 MED ORDER — MEASLES, MUMPS & RUBELLA VAC ~~LOC~~ INJ: 0.5000 mL | INJECTION | Freq: Once | SUBCUTANEOUS | Status: DC

## 2012-08-13 MED ORDER — PRENATAL MULTIVITAMIN CH
1.0000 | ORAL_TABLET | Freq: Every day | ORAL | Status: DC
  Administered 2012-08-15: 1 via ORAL
  Filled 2012-08-13: qty 1

## 2012-08-13 MED ORDER — FLEET ENEMA 7-19 GM/118ML RE ENEM: 1.0000 | ENEMA | Freq: Every day | RECTAL | Status: DC | PRN

## 2012-08-13 MED ORDER — PHENYLEPHRINE HCL 10 MG/ML IJ SOLN
INTRAMUSCULAR | Status: DC | PRN
  Administered 2012-08-13: 40 ug via INTRAVENOUS
  Administered 2012-08-13: 80 ug via INTRAVENOUS
  Administered 2012-08-13 (×4): 40 ug via INTRAVENOUS

## 2012-08-13 MED ORDER — MEPERIDINE HCL 25 MG/ML IJ SOLN: 6.2500 mg | INTRAMUSCULAR | Status: DC | PRN

## 2012-08-13 MED ORDER — OXYTOCIN 40 UNITS IN LACTATED RINGERS INFUSION - SIMPLE MED
INTRAVENOUS | Status: DC | PRN
  Administered 2012-08-13: 40 [IU] via INTRAVENOUS

## 2012-08-13 MED ORDER — ONDANSETRON HCL 4 MG/2ML IJ SOLN
INTRAMUSCULAR | Status: AC
  Filled 2012-08-13: qty 2

## 2012-08-13 MED ORDER — FENTANYL CITRATE 0.05 MG/ML IJ SOLN
INTRAMUSCULAR | Status: AC
  Filled 2012-08-13: qty 2

## 2012-08-13 MED ORDER — CEFAZOLIN SODIUM-DEXTROSE 2-3 GM-% IV SOLR
2.0000 g | INTRAVENOUS | Status: AC
  Administered 2012-08-13: 2 g via INTRAVENOUS

## 2012-08-13 MED ORDER — OXYTOCIN 10 UNIT/ML IJ SOLN
INTRAMUSCULAR | Status: AC
  Filled 2012-08-13: qty 4

## 2012-08-13 MED ORDER — METOCLOPRAMIDE HCL 5 MG/ML IJ SOLN: 10.0000 mg | Freq: Three times a day (TID) | INTRAMUSCULAR | Status: DC | PRN

## 2012-08-13 MED ORDER — LACTATED RINGERS IV SOLN
INTRAVENOUS | Status: DC
  Administered 2012-08-13 (×3): via INTRAVENOUS

## 2012-08-13 MED ORDER — SCOPOLAMINE 1 MG/3DAYS TD PT72: 1.0000 | MEDICATED_PATCH | Freq: Once | TRANSDERMAL | Status: DC

## 2012-08-13 MED ORDER — IBUPROFEN 800 MG PO TABS
800.0000 mg | ORAL_TABLET | Freq: Three times a day (TID) | ORAL | Status: DC | PRN
  Administered 2012-08-13 – 2012-08-16 (×7): 800 mg via ORAL
  Filled 2012-08-13 (×7): qty 1

## 2012-08-13 MED ORDER — SODIUM CHLORIDE 0.9 % IJ SOLN: 3.0000 mL | Freq: Two times a day (BID) | INTRAMUSCULAR | Status: DC

## 2012-08-13 MED ORDER — OXYTOCIN 40 UNITS IN LACTATED RINGERS INFUSION - SIMPLE MED: 62.5000 mL/h | INTRAVENOUS | Status: AC

## 2012-08-13 MED ORDER — LANOLIN HYDROUS EX OINT: 1.0000 "application " | TOPICAL_OINTMENT | CUTANEOUS | Status: DC | PRN

## 2012-08-13 MED ORDER — SODIUM CHLORIDE 0.9 % IJ SOLN: 3.0000 mL | INTRAMUSCULAR | Status: DC | PRN

## 2012-08-13 MED ORDER — SODIUM CHLORIDE 0.9 % IV SOLN: 250.0000 mL | INTRAVENOUS | Status: DC

## 2012-08-13 MED ORDER — SIMETHICONE 80 MG PO CHEW: 80.0000 mg | CHEWABLE_TABLET | ORAL | Status: DC | PRN

## 2012-08-13 MED ORDER — KETOROLAC TROMETHAMINE 30 MG/ML IJ SOLN: 30.0000 mg | Freq: Four times a day (QID) | INTRAMUSCULAR | Status: AC | PRN

## 2012-08-13 MED ORDER — NALOXONE HCL 1 MG/ML IJ SOLN
1.0000 ug/kg/h | INTRAVENOUS | Status: DC | PRN
  Filled 2012-08-13: qty 2

## 2012-08-13 MED ORDER — KETOROLAC TROMETHAMINE 30 MG/ML IJ SOLN
INTRAMUSCULAR | Status: AC
  Administered 2012-08-13: 30 mg via INTRAVENOUS
  Filled 2012-08-13: qty 1

## 2012-08-13 MED ORDER — ONDANSETRON HCL 4 MG/2ML IJ SOLN
INTRAMUSCULAR | Status: DC | PRN
  Administered 2012-08-13: 4 mg via INTRAVENOUS

## 2012-08-13 MED ORDER — FENTANYL CITRATE 0.05 MG/ML IJ SOLN
INTRAMUSCULAR | Status: AC
  Administered 2012-08-13: 50 ug via INTRAVENOUS
  Filled 2012-08-13: qty 2

## 2012-08-13 MED ORDER — SCOPOLAMINE 1 MG/3DAYS TD PT72
MEDICATED_PATCH | TRANSDERMAL | Status: AC
  Administered 2012-08-13: 1.5 mg via TRANSDERMAL
  Filled 2012-08-13: qty 1

## 2012-08-13 MED ORDER — DIPHENHYDRAMINE HCL 25 MG PO CAPS: 25.0000 mg | ORAL_CAPSULE | Freq: Four times a day (QID) | ORAL | Status: DC | PRN

## 2012-08-13 MED ORDER — 0.9 % SODIUM CHLORIDE (POUR BTL) OPTIME
TOPICAL | Status: DC | PRN
  Administered 2012-08-13: 1000 mL

## 2012-08-13 MED ORDER — DIPHENHYDRAMINE HCL 50 MG/ML IJ SOLN: 25.0000 mg | INTRAMUSCULAR | Status: DC | PRN

## 2012-08-13 MED ORDER — BUPIVACAINE IN DEXTROSE 0.75-8.25 % IT SOLN
INTRATHECAL | Status: DC | PRN
  Administered 2012-08-13: 1.3 mL via INTRATHECAL

## 2012-08-13 MED ORDER — MORPHINE SULFATE 0.5 MG/ML IJ SOLN
INTRAMUSCULAR | Status: AC
  Filled 2012-08-13: qty 10

## 2012-08-13 MED ORDER — SENNOSIDES-DOCUSATE SODIUM 8.6-50 MG PO TABS
2.0000 | ORAL_TABLET | Freq: Every day | ORAL | Status: DC
  Administered 2012-08-13 – 2012-08-15 (×3): 2 via ORAL

## 2012-08-13 MED ORDER — MENTHOL 3 MG MT LOZG: 1.0000 | LOZENGE | OROMUCOSAL | Status: DC | PRN

## 2012-08-13 MED ORDER — NALBUPHINE HCL 10 MG/ML IJ SOLN
5.0000 mg | INTRAMUSCULAR | Status: DC | PRN
  Filled 2012-08-13: qty 1

## 2012-08-13 MED ORDER — KETOROLAC TROMETHAMINE 30 MG/ML IJ SOLN
INTRAMUSCULAR | Status: AC
  Filled 2012-08-13: qty 1

## 2012-08-13 MED ORDER — MORPHINE SULFATE (PF) 0.5 MG/ML IJ SOLN
INTRAMUSCULAR | Status: DC | PRN
  Administered 2012-08-13: .15 mg via INTRATHECAL

## 2012-08-13 MED ORDER — SIMETHICONE 80 MG PO CHEW
80.0000 mg | CHEWABLE_TABLET | Freq: Three times a day (TID) | ORAL | Status: DC
  Administered 2012-08-13 – 2012-08-16 (×9): 80 mg via ORAL

## 2012-08-13 MED ORDER — IBUPROFEN 600 MG PO TABS
600.0000 mg | ORAL_TABLET | Freq: Four times a day (QID) | ORAL | Status: DC | PRN
  Filled 2012-08-13: qty 1

## 2012-08-13 MED ORDER — DIPHENHYDRAMINE HCL 25 MG PO CAPS
25.0000 mg | ORAL_CAPSULE | ORAL | Status: DC | PRN
  Filled 2012-08-13: qty 1

## 2012-08-13 MED ORDER — PHENYLEPHRINE 40 MCG/ML (10ML) SYRINGE FOR IV PUSH (FOR BLOOD PRESSURE SUPPORT)
PREFILLED_SYRINGE | INTRAVENOUS | Status: AC
  Filled 2012-08-13: qty 5

## 2012-08-13 MED ORDER — ZOLPIDEM TARTRATE 5 MG PO TABS: 5.0000 mg | ORAL_TABLET | Freq: Every evening | ORAL | Status: DC | PRN

## 2012-08-13 MED ORDER — WITCH HAZEL-GLYCERIN EX PADS: 1.0000 "application " | MEDICATED_PAD | CUTANEOUS | Status: DC | PRN

## 2012-08-13 MED ORDER — DIPHENHYDRAMINE HCL 50 MG/ML IJ SOLN: 12.5000 mg | INTRAMUSCULAR | Status: DC | PRN

## 2012-08-13 MED ORDER — NALOXONE HCL 0.4 MG/ML IJ SOLN: 0.4000 mg | INTRAMUSCULAR | Status: DC | PRN

## 2012-08-13 MED ORDER — FENTANYL CITRATE 0.05 MG/ML IJ SOLN
INTRAMUSCULAR | Status: DC | PRN
  Administered 2012-08-13: 25 ug via INTRATHECAL

## 2012-08-13 MED ORDER — EPHEDRINE 5 MG/ML INJ
INTRAVENOUS | Status: AC
  Filled 2012-08-13: qty 10

## 2012-08-13 MED ORDER — OXYCODONE-ACETAMINOPHEN 5-325 MG PO TABS
1.0000 | ORAL_TABLET | Freq: Four times a day (QID) | ORAL | Status: DC | PRN
  Administered 2012-08-14 (×7): 1 via ORAL
  Filled 2012-08-13 (×2): qty 1
  Filled 2012-08-13: qty 2
  Filled 2012-08-13 (×5): qty 1

## 2012-08-13 MED ORDER — FENTANYL CITRATE 0.05 MG/ML IJ SOLN
25.0000 ug | INTRAMUSCULAR | Status: DC | PRN
  Administered 2012-08-13: 50 ug via INTRAVENOUS

## 2012-08-13 MED ORDER — LABETALOL HCL 100 MG PO TABS
100.0000 mg | ORAL_TABLET | Freq: Two times a day (BID) | ORAL | Status: DC
  Administered 2012-08-14 – 2012-08-16 (×4): 100 mg via ORAL
  Filled 2012-08-13 (×6): qty 1

## 2012-08-13 SURGICAL SUPPLY — 26 items
CLOTH BEACON ORANGE TIMEOUT ST (SAFETY) ×2 IMPLANT
DRAPE LG THREE QUARTER DISP (DRAPES) ×2 IMPLANT
DRSG OPSITE POSTOP 4X10 (GAUZE/BANDAGES/DRESSINGS) ×2 IMPLANT
DURAPREP 26ML APPLICATOR (WOUND CARE) ×2 IMPLANT
ELECT REM PT RETURN 9FT ADLT (ELECTROSURGICAL) ×2
ELECTRODE REM PT RTRN 9FT ADLT (ELECTROSURGICAL) ×1 IMPLANT
EXTRACTOR VACUUM M CUP 4 TUBE (SUCTIONS) IMPLANT
GAUZE SPONGE 4X4 12PLY STRL LF (GAUZE/BANDAGES/DRESSINGS) ×1 IMPLANT
GLOVE BIO SURGEON STRL SZ7 (GLOVE) ×2 IMPLANT
GOWN STRL REIN XL XLG (GOWN DISPOSABLE) ×4 IMPLANT
KIT ABG SYR 3ML LUER SLIP (SYRINGE) IMPLANT
NDL HYPO 25X5/8 SAFETYGLIDE (NEEDLE) ×1 IMPLANT
NEEDLE HYPO 25X5/8 SAFETYGLIDE (NEEDLE) ×2 IMPLANT
NS IRRIG 1000ML POUR BTL (IV SOLUTION) ×2 IMPLANT
PACK C SECTION WH (CUSTOM PROCEDURE TRAY) ×2 IMPLANT
PAD OB MATERNITY 4.3X12.25 (PERSONAL CARE ITEMS) ×2 IMPLANT
SLEEVE SCD COMPRESS KNEE MED (MISCELLANEOUS) IMPLANT
STRIP CLOSURE SKIN 1/2X4 (GAUZE/BANDAGES/DRESSINGS) ×2 IMPLANT
SUT CHROMIC 0 CTX 36 (SUTURE) ×6 IMPLANT
SUT MON AB 4-0 PS1 27 (SUTURE) ×2 IMPLANT
SUT PDS AB 0 CT1 27 (SUTURE) ×4 IMPLANT
SUT VIC AB 3-0 CT1 27 (SUTURE) ×4
SUT VIC AB 3-0 CT1 TAPERPNT 27 (SUTURE) ×2 IMPLANT
TOWEL OR 17X24 6PK STRL BLUE (TOWEL DISPOSABLE) ×6 IMPLANT
TRAY FOLEY CATH 14FR (SET/KITS/TRAYS/PACK) ×2 IMPLANT
WATER STERILE IRR 1000ML POUR (IV SOLUTION) ×2 IMPLANT

## 2012-08-13 NOTE — H&P (Signed)
Anita [name]  DICTATION # [id number] CSN# [id number]   Meriel Pica, MD 08/13/2012 9:19 AM

## 2012-08-13 NOTE — H&P (Signed)
[name], [name]                ACCOUNT NO.:  [id number]  MEDICAL RECORD NO.:  [id number]  LOCATION:  [facility]                           FACILITY:  [facility]  PHYSICIAN:  Duke Salvia. Marcelle [name], M.D.DATE OF BIRTH:  Apr 05, 1981  DATE OF ADMISSION:  08/12/2012 DATE OF DISCHARGE:  08/12/2012                             HISTORY & PHYSICAL   CHIEF COMPLAINT:  For repeat cesarean section at 37 weeks, SGA, chronic hypertension, gestational diabetes, worsening hypertension.  HISTORY OF PRESENT ILLNESS:  A 32 year old, G2, P1-0-0-1, EDD 4/1, EGA [redacted] weeks.  This patient has been on labetalol 100 mg p.o. b.i.d. since the beginning of her pregnancy with reasonable blood pressure control. She also has gestational diabetes, but has had a good CBG control. Recently on serial ultrasound, has had decreased EFW which has been right at the 10-11 percentile with normal AFI, BPP 8/8.  She was seen 2 days ago with worsening headache that required evaluation in MAU with normal PIH labs, but she did require Percocet for headache with BP 170/100.  Due to worsening hypertension, and fetal growth at 10-11 percentile, decision was made to proceed with RCS at 37 weeks.  PAST MEDICAL HISTORY:  Please see the [facility] form for details.  PHYSICAL EXAMINATION:  VITAL SIGNS:  Temp 98.2, blood pressure 170/100. HEENT:  Unremarkable. NECK:  Supple without masses. LUNGS:  Clear. CARDIOVASCULAR:  Regular rate and rhythm without murmurs, rubs, gallops. BREASTS:  Without masses. GU:  Fundal height was 35 cm.  Fetal heart rate 140, cervix was closed. EXTREMITIES:  Reveal 1+ reflexes, 1+ edema.  IMPRESSION:  A 37 week intrauterine pregnancy, history of well- controlled gestational diabetes mellitus, chronic hypertension on labetalol, worsening hypertension, small for gestational age with estimated fetal weight at the 10th to 11th percentile.  PLAN:  RCS.  Decision to proceed at this point, discussed with the patient, because of  her symptomatic headache and worsening hypertension. She understands and agrees with the plan.  This procedure including risks related to bleeding infection, wound infection, transfusion along with her expected recovery time reviewed, which she understands and accepts.    Anita [name], M.D.    RMH/MEDQ  D:  08/13/2012  T:  08/13/2012  Job:  [id number]

## 2012-08-13 NOTE — Anesthesia Procedure Notes (Signed)
Spinal  Patient location during procedure: OR Start time: 08/13/2012 1:04 PM Staffing Anesthesiologist: FOSTER, MICHAEL A. Performed by: anesthesiologist  Preanesthetic Checklist Completed: patient identified, site marked, surgical consent, pre-op evaluation, timeout performed, IV checked, risks and benefits discussed and monitors and equipment checked Spinal Block Patient position: sitting Prep: site prepped and draped and DuraPrep Patient monitoring: heart rate, cardiac monitor, continuous pulse ox and blood pressure Approach: midline Location: L3-4 Injection technique: single-shot Needle Needle type: Sprotte  Needle gauge: 24 G Needle length: 9 cm Needle insertion depth: 4 cm Assessment Sensory level: T4 Additional Notes Patient tolerated procedure well. Adequate sensory level.

## 2012-08-13 NOTE — Anesthesia Preprocedure Evaluation (Addendum)
Anesthesia Evaluation  Patient identified by MRN, date of birth, ID band Patient awake    Reviewed: Allergy & Precautions, H&P , NPO status , Patient's Chart, lab work & pertinent test results  Airway Mallampati: III TM Distance: >3 FB Neck ROM: Full    Dental no notable dental hx. (+) Teeth Intact   Pulmonary neg pulmonary ROS, neg pneumonia -,  breath sounds clear to auscultation  Pulmonary exam normal       Cardiovascular hypertension, Pt. on medications and Pt. on home beta blockers Rhythm:Regular Rate:Normal     Neuro/Psych negative neurological ROS  negative psych ROS   GI/Hepatic GERD-  Medicated and Controlled,  Endo/Other  diabetes, Well Controlled, Gestational, Oral Hypoglycemic Agents  Renal/GU   negative genitourinary   Musculoskeletal negative musculoskeletal ROS (+)   Abdominal   Peds  Hematology negative hematology ROS (+)   Anesthesia Other Findings   Reproductive/Obstetrics (+) Pregnancy SGA                         Anesthesia Physical Anesthesia Plan  ASA: II  Anesthesia Plan: Spinal   Post-op Pain Management:    Induction:   Airway Management Planned: Natural Airway  Additional Equipment:   Intra-op Plan:   Post-operative Plan:   Informed Consent: I have reviewed the patients History and Physical, chart, labs and discussed the procedure including the risks, benefits and alternatives for the proposed anesthesia with the patient or authorized representative who has indicated his/her understanding and acceptance.   Dental advisory given  Plan Discussed with: CRNA, Anesthesiologist and Surgeon  Anesthesia Plan Comments:         Anesthesia Quick Evaluation

## 2012-08-13 NOTE — Transfer of Care (Signed)
Immediate Anesthesia Transfer of Care Note  Patient: Anita [name]  Procedure(s) Performed: Procedure(s) with comments: CESAREAN SECTION (N/A) - Repeat edc 09/03/12  Patient Location: PACU  Anesthesia Type:Spinal  Level of Consciousness: awake, alert  and oriented  Airway & Oxygen Therapy: Patient Spontanous Breathing  Post-op Assessment: Report given to PACU RN and Post -op Vital signs reviewed and stable  Post vital signs: Reviewed and stable  Complications: No apparent anesthesia complications

## 2012-08-13 NOTE — Anesthesia Postprocedure Evaluation (Signed)
  Anesthesia Post-op Note  Patient: Anita [name]  Procedure(s) Performed: Procedure(s) with comments: CESAREAN SECTION (N/A) - Repeat edc 09/03/12  Patient Location: PACU  Anesthesia Type:Spinal  Level of Consciousness: awake, alert  and oriented  Airway and Oxygen Therapy: Patient Spontanous Breathing  Post-op Pain: none  Post-op Assessment: Post-op Vital signs reviewed, Patient's Cardiovascular Status Stable, Respiratory Function Stable, Patent Airway, No signs of Nausea or vomiting, Pain level controlled, No headache and No backache  Post-op Vital Signs: Reviewed and stable  Complications: No apparent anesthesia complications

## 2012-08-13 NOTE — Op Note (Signed)
Preoperative diagnosis: 1. 37 week IUP 2. Chronic hypertension, worsening hypertension 3. SGA 4. Gestational diabetes  Postoperative diagnosis: Same   Procedure: Repeat low transverse cesarean section  Surgeon: Marcelle Overlie  Anesthesia: Spinal  EBL: 700 cc  Complications: None  Procedure and findings:  The patient taken the operating room after an adequate level of spinal anesthetic was obtained with the patient in left tilt position the abdomen prepped and draped in the usual fashion. Foley catheter positioned draining clear urine. Appropriate timeout for taken at that point. Transverse incision made tibial scar which is well healed, this carried down to the fascia which was incised and extended transversely. Rectus muscles divided in the midline, peritoneum entered superiorly without incident and extended vertically. The vesicouterine serosa was incised and the bladder was bluntly and sharply dissected below, bladder blade repositioned. Transverse incision made in the lower segment it is moderately thin this was extended with blunt dissection clear fluid noted the patient delivered of a healthy female Apgars 9 and 9 clear fluid the infant was suctioned, cord clamped and passed the pediatric team for further care. Cord pH was sent, so was removed manually intact, sent to pathology, uterus exteriorized cavity wiped clean with a laparotomy pack closure obtained the first layer of 0 chromic in a locked fashion followed by an imbricating layer of 0 chromic. This is hemostatic the bladder flap area was intact and hemostatic bilateral tubes and ovaries were normal. Prior to closure sponge denies precast reported as correct x2. Peritoneum closed the running 2-0 Vicryl suture, 2-0 Vicryl interrupted sutures used to reapproximate the rectus muscles in the midline 0 PDS was then used from laterally to midline on either side to close the fascia. Subcutaneous tissue was fairly thin and hemostatic was not closed  separately. 4-0 Monocryl subcuticular closure with a pressure dressing applied she tolerated this well went to recovery room in good condition.  Dictated with [facility]  Richard M. Milana Obey.D.

## 2012-08-14 ENCOUNTER — Encounter (HOSPITAL_COMMUNITY): Payer: Self-pay | Admitting: Obstetrics and Gynecology

## 2012-08-14 LAB — CBC
HCT: 31.7 % — ABNORMAL LOW (ref 36.0–46.0)
Hemoglobin: 10.6 g/dL — ABNORMAL LOW (ref 12.0–15.0)
MCV: 91.9 fL (ref 78.0–100.0)
RDW: 13.4 % (ref 11.5–15.5)
WBC: 12.2 10*3/uL — ABNORMAL HIGH (ref 4.0–10.5)

## 2012-08-14 MED ORDER — OXYCODONE-ACETAMINOPHEN 5-325 MG PO TABS
1.0000 | ORAL_TABLET | ORAL | Status: DC | PRN
  Administered 2012-08-14 – 2012-08-15 (×5): 2 via ORAL
  Administered 2012-08-16 (×2): 1 via ORAL
  Filled 2012-08-14 (×2): qty 2
  Filled 2012-08-14: qty 1
  Filled 2012-08-14 (×2): qty 2
  Filled 2012-08-14: qty 1

## 2012-08-14 NOTE — Anesthesia Postprocedure Evaluation (Signed)
  Anesthesia Post-op Note  Patient: Anita Hall  Procedure(s) Performed: Procedure(s) with comments: CESAREAN SECTION (N/A) - Repeat edc 09/03/12  Patient Location: Mother/Baby  Anesthesia Type:Spinal  Level of Consciousness: awake, alert  and oriented  Airway and Oxygen Therapy: Patient Spontanous Breathing  Post-op Pain: mild  Post-op Assessment: Post-op Vital signs reviewed, Patient's Cardiovascular Status Stable, No headache, No backache, No residual numbness and No residual motor weakness  Post-op Vital Signs: Reviewed and stable  Complications: No apparent anesthesia complications

## 2012-08-14 NOTE — Progress Notes (Addendum)
Post Partum Day 1 Subjective: no complaints  Objective: Blood pressure 121/64, pulse 77, temperature 98.1 F (36.7 C), temperature source Oral, resp. rate 18, weight 70.308 kg (155 lb), SpO2 91.00%, unknown if currently breastfeeding.  Physical Exam:  General: alert and cooperative Lochia: appropriate Uterine Fundus: firm Incision: bandage clean and dry DVT Evaluation: No evidence of DVT seen on physical exam.   Recent Labs  08/12/12 1553 08/14/12 0613  HGB 11.7* 10.6*  HCT 35.0* 31.7*    Assessment/Plan: Routine postop care   LOS: 1 day   ADKINS,GRETCHEN 08/14/2012, 8:23 AM

## 2012-08-15 NOTE — Progress Notes (Signed)
Subjective: Postpartum Day 2: Cesarean Delivery Patient reports tolerating PO, + flatus and no problems voiding.    Objective: Vital signs in last 24 hours: Temp:  [98.5 F (36.9 C)-99.2 F (37.3 C)] 98.5 F (36.9 C) (03/13 1821) Pulse Rate:  [79-93] 93 (03/13 2137) Resp:  [16-20] 20 (03/13 1821) BP: (127-134)/(63-79) 134/73 mmHg (03/13 2137) SpO2:  [97 %] 97 % (03/13 1349)  Physical Exam:  General: alert and cooperative Lochia: appropriate Uterine Fundus: firm Incision: honeycomb dressing noted with old drainage noted in bandage.no active bleeding observed DVT Evaluation: No evidence of DVT seen on physical exam. No significant calf/ankle edema. Patient was in and out cathed yesterday, no problems voiding today  Recent Labs  08/12/12 1553 08/14/12 0613  HGB 11.7* 10.6*  HCT 35.0* 31.7*    Assessment/Plan: Status post Cesarean section. Doing well postoperatively.  Continue current care.  CURTIS,CAROL G 08/15/2012, 8:00 AM

## 2012-08-15 NOTE — Plan of Care (Signed)
Problem: Phase II Progression Outcomes Goal: Other Phase II Outcomes/Goals Pain management changes made with MD support on previous shift. Pt. Says her pain is better controlled.

## 2012-08-16 DIAGNOSIS — Z98891 History of uterine scar from previous surgery: Secondary | ICD-10-CM | POA: Clinically undetermined

## 2012-08-16 DIAGNOSIS — O1002 Pre-existing essential hypertension complicating childbirth: Principal | ICD-10-CM

## 2012-08-16 DIAGNOSIS — O24419 Gestational diabetes mellitus in pregnancy, unspecified control: Secondary | ICD-10-CM | POA: Diagnosis present

## 2012-08-16 MED ORDER — OXYCODONE-ACETAMINOPHEN 5-325 MG PO TABS: 1.0000 | ORAL_TABLET | ORAL | Status: DC | PRN

## 2012-08-16 NOTE — Discharge Summary (Signed)
Obstetric Discharge Summary Reason for Admission: cesarean section chronic hypertension . Gestational diabetes. Small for gestation age Prenatal Procedures: NST and ultrasound Intrapartum Procedures: cesarean: low cervical, transverse Postpartum Procedures: none Complications-Operative and Postpartum: none Hemoglobin  Date Value Range Status  08/14/2012 10.6* 12.0 - 15.0 g/dL Final     HCT  Date Value Range Status  08/14/2012 31.7* 36.0 - 46.0 % Final    Physical Exam:  General: alert Lochia: appropriate Uterine Fundus: firm Incision: healing well DVT Evaluation: No evidence of DVT seen on physical exam.  Discharge Diagnoses: Term Pregnancy-delivered  Discharge Information: Date: 08/16/2012 Activity: pelvic rest Diet: routine Medications: PNV, Ibuprofen and Percocet Condition: stable Instructions: refer to practice specific booklet Discharge to: home   Newborn Data: Live born female  Birth Weight: 5 lb 1.7 oz (2315 g) APGAR: 9, 9  Home with mother.  Anita Hall 08/16/2012, 10:55 AM

## 2012-09-02 ENCOUNTER — Encounter (HOSPITAL_COMMUNITY): Payer: Self-pay | Admitting: *Deleted

## 2014-04-05 ENCOUNTER — Encounter (HOSPITAL_COMMUNITY): Payer: Self-pay | Admitting: *Deleted

## 2014-08-09 IMAGING — US US FETAL BPP W/O NONSTRESS
1 series · 7 of 7 positions shown · non-contrast
Comparison: none

[Series 1: us fetal bpp w/o nonstress · non-contrast · 7 acquisitions, 7 frames shown]
[im 1/7]
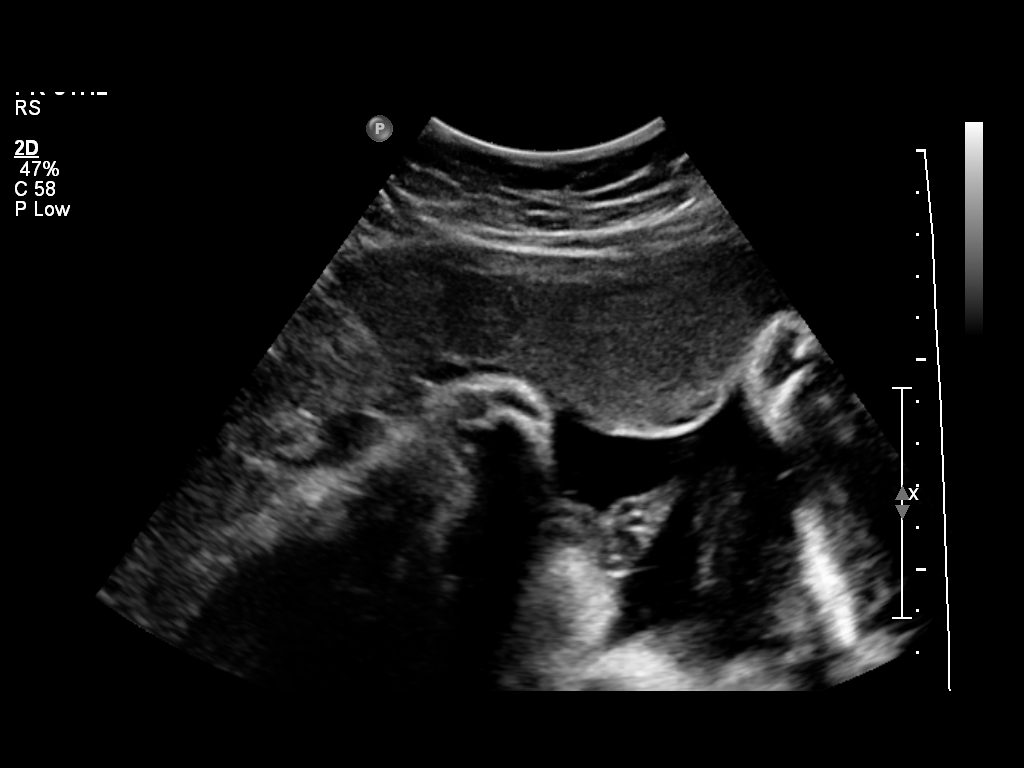
[im 2/7]
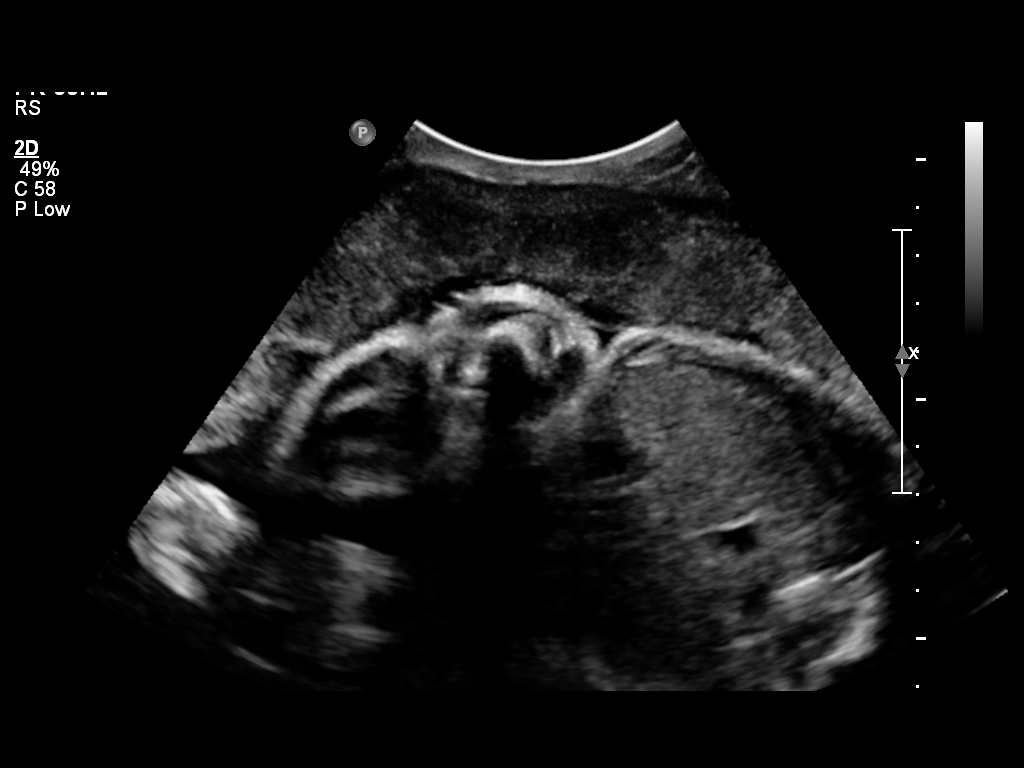
[im 3/7]
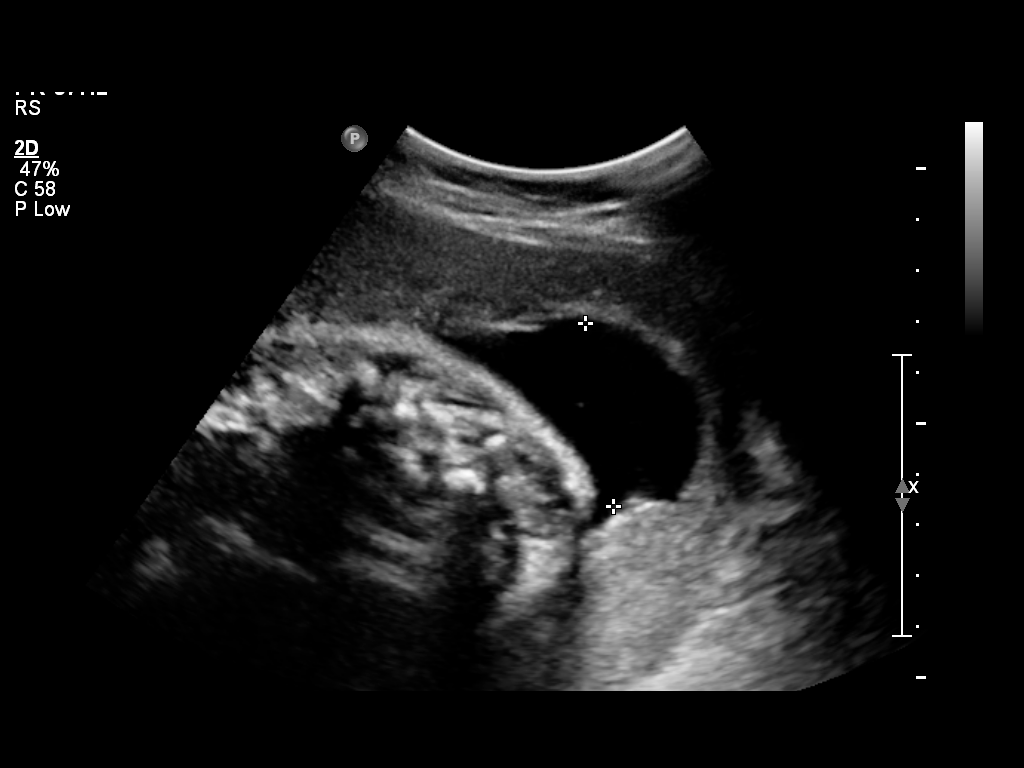
[im 4/7]
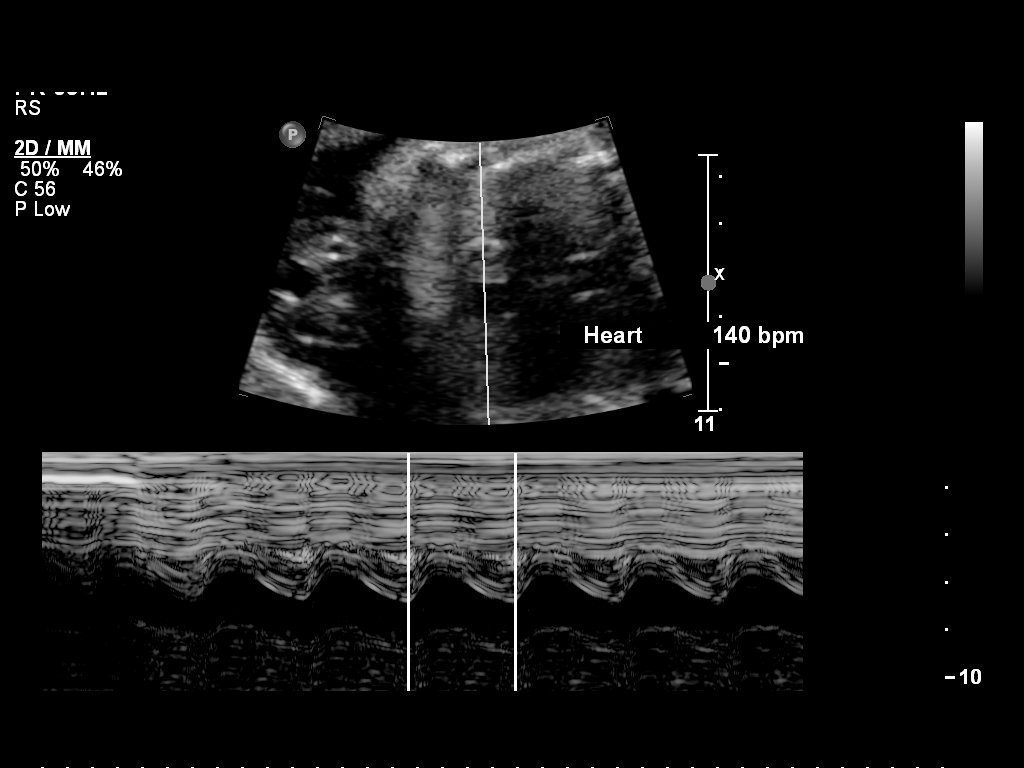
[im 5/7]
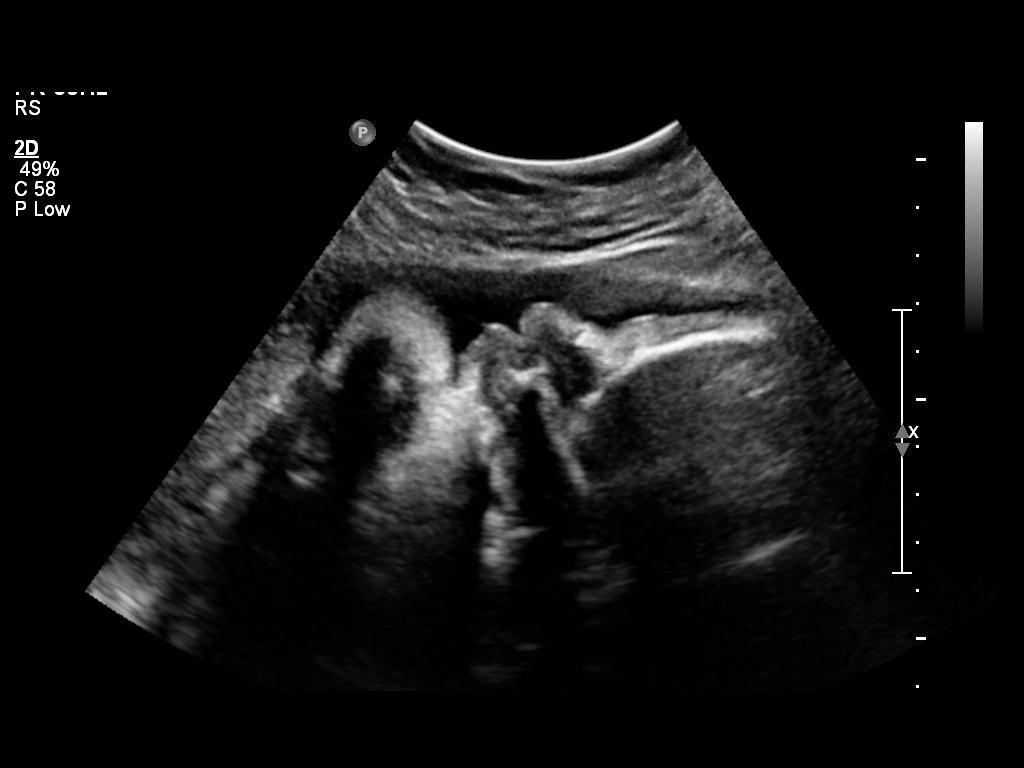
[im 6/7]
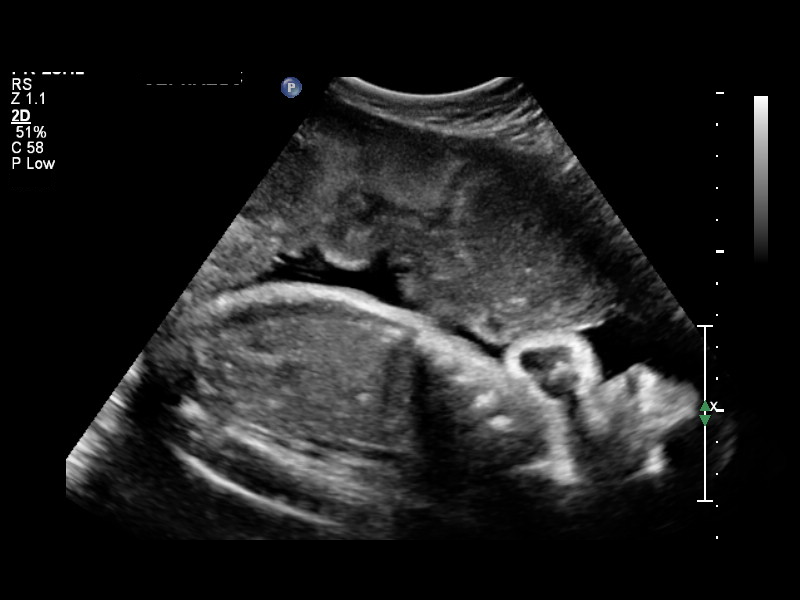
[im 7/7]
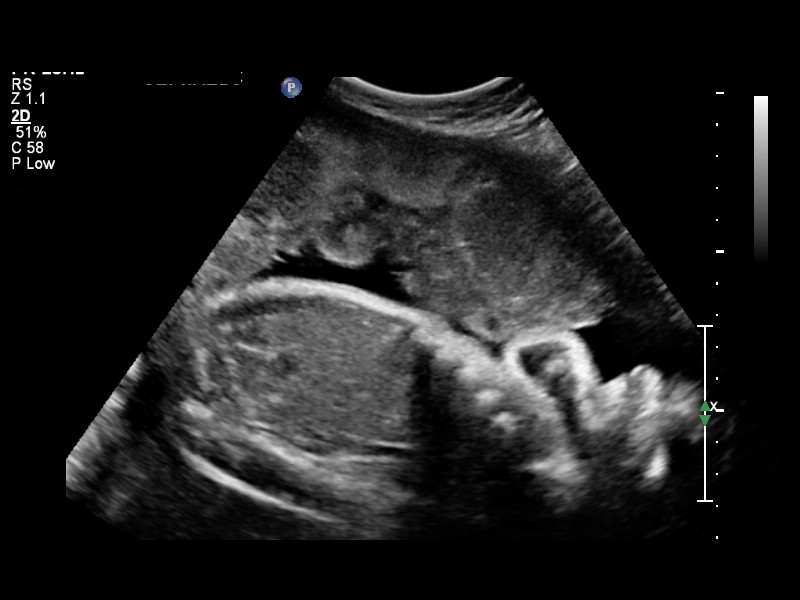

[7 of 7 positions shown; findings below may reference images not displayed]

OBSTETRICS REPORT
                      (Signed Final 08/01/2012 [DATE])

Service(s) Provided

Indications

 Hypertension - Gestational
Fetal Evaluation

 Num Of Fetuses:    1
 Fetal Heart Rate:  140                         bpm
 Cardiac Activity:  Observed
 Presentation:      Cephalic
 Placenta:          Anterior, above cervical os

 Amniotic Fluid
 AFI FV:      Subjectively within normal limits
                                             Larg Pckt:   3.61   cm
 LUQ:   3.61   cm
Biophysical Evaluation

 Amniotic F.V:   Pocket => 2 cm two         F. Tone:        Observed
                 planes
 F. Movement:    Observed                   Score:          [DATE]
 F. Breathing:   Not Observed
Gestational Age

 LMP:           35w 2d       Date:   11/28/11                 EDD:   09/03/12
 Clinical EDD:  35w 2d                                        EDD:   09/03/12
 Best:          35w 2d    Det. By:   LMP  (11/28/11)          EDD:   09/03/12
Cervix Uterus Adnexa

 Cervix:       Not adaquately visualized
Impression

 BPP [DATE] with zero scored for lack of sustained fetal breathing.

 Subjectively normal amniotic fluid volume with a single pocket
 measuring > 2 x 2 cm noted.
 questions or concerns.

## 2014-11-02 ENCOUNTER — Other Ambulatory Visit: Payer: Self-pay | Admitting: Obstetrics and Gynecology

## 2014-11-03 LAB — CYTOLOGY - PAP

## 2015-06-30 DIAGNOSIS — N912 Amenorrhea, unspecified: Secondary | ICD-10-CM | POA: Diagnosis not present

## 2015-07-04 DIAGNOSIS — N912 Amenorrhea, unspecified: Secondary | ICD-10-CM | POA: Diagnosis not present

## 2015-07-25 DIAGNOSIS — N911 Secondary amenorrhea: Secondary | ICD-10-CM | POA: Diagnosis not present

## 2015-08-01 DIAGNOSIS — N911 Secondary amenorrhea: Secondary | ICD-10-CM | POA: Diagnosis not present

## 2015-08-01 DIAGNOSIS — O021 Missed abortion: Secondary | ICD-10-CM | POA: Diagnosis not present

## 2015-08-04 DIAGNOSIS — O021 Missed abortion: Secondary | ICD-10-CM | POA: Diagnosis not present

## 2015-08-15 DIAGNOSIS — Z09 Encounter for follow-up examination after completed treatment for conditions other than malignant neoplasm: Secondary | ICD-10-CM | POA: Diagnosis not present

## 2015-08-15 DIAGNOSIS — D509 Iron deficiency anemia, unspecified: Secondary | ICD-10-CM | POA: Diagnosis not present

## 2015-10-14 DIAGNOSIS — I10 Essential (primary) hypertension: Secondary | ICD-10-CM | POA: Diagnosis not present

## 2016-02-23 DIAGNOSIS — I10 Essential (primary) hypertension: Secondary | ICD-10-CM | POA: Diagnosis not present

## 2016-02-23 DIAGNOSIS — Z23 Encounter for immunization: Secondary | ICD-10-CM | POA: Diagnosis not present

## 2016-02-23 DIAGNOSIS — Z0000 Encounter for general adult medical examination without abnormal findings: Secondary | ICD-10-CM | POA: Diagnosis not present

## 2016-03-10 DIAGNOSIS — R739 Hyperglycemia, unspecified: Secondary | ICD-10-CM | POA: Diagnosis not present

## 2016-03-10 DIAGNOSIS — E876 Hypokalemia: Secondary | ICD-10-CM | POA: Diagnosis not present

## 2016-03-10 DIAGNOSIS — R740 Nonspecific elevation of levels of transaminase and lactic acid dehydrogenase [LDH]: Secondary | ICD-10-CM | POA: Diagnosis not present

## 2016-07-23 DIAGNOSIS — J0100 Acute maxillary sinusitis, unspecified: Secondary | ICD-10-CM | POA: Diagnosis not present

## 2016-08-23 DIAGNOSIS — R05 Cough: Secondary | ICD-10-CM | POA: Diagnosis not present

## 2016-08-23 DIAGNOSIS — R0781 Pleurodynia: Secondary | ICD-10-CM | POA: Diagnosis not present

## 2016-08-23 DIAGNOSIS — I10 Essential (primary) hypertension: Secondary | ICD-10-CM | POA: Diagnosis not present

## 2016-09-05 DIAGNOSIS — R0781 Pleurodynia: Secondary | ICD-10-CM | POA: Diagnosis not present

## 2016-09-12 DIAGNOSIS — R918 Other nonspecific abnormal finding of lung field: Secondary | ICD-10-CM | POA: Diagnosis not present

## 2016-09-20 DIAGNOSIS — R0781 Pleurodynia: Secondary | ICD-10-CM | POA: Diagnosis not present

## 2016-09-28 DIAGNOSIS — R0781 Pleurodynia: Secondary | ICD-10-CM | POA: Diagnosis not present

## 2016-09-28 DIAGNOSIS — R1011 Right upper quadrant pain: Secondary | ICD-10-CM | POA: Diagnosis not present

## 2016-10-31 DIAGNOSIS — N912 Amenorrhea, unspecified: Secondary | ICD-10-CM | POA: Diagnosis not present

## 2016-11-02 DIAGNOSIS — N912 Amenorrhea, unspecified: Secondary | ICD-10-CM | POA: Diagnosis not present

## 2016-11-07 DIAGNOSIS — N912 Amenorrhea, unspecified: Secondary | ICD-10-CM | POA: Diagnosis not present

## 2016-11-13 DIAGNOSIS — N911 Secondary amenorrhea: Secondary | ICD-10-CM | POA: Diagnosis not present

## 2016-11-19 DIAGNOSIS — N911 Secondary amenorrhea: Secondary | ICD-10-CM | POA: Diagnosis not present

## 2016-11-22 DIAGNOSIS — Z3A01 Less than 8 weeks gestation of pregnancy: Secondary | ICD-10-CM | POA: Diagnosis not present

## 2016-11-22 DIAGNOSIS — N911 Secondary amenorrhea: Secondary | ICD-10-CM | POA: Diagnosis not present

## 2016-11-22 DIAGNOSIS — O4691 Antepartum hemorrhage, unspecified, first trimester: Secondary | ICD-10-CM | POA: Diagnosis not present

## 2016-11-29 DIAGNOSIS — N911 Secondary amenorrhea: Secondary | ICD-10-CM | POA: Diagnosis not present

## 2016-12-06 DIAGNOSIS — N911 Secondary amenorrhea: Secondary | ICD-10-CM | POA: Diagnosis not present

## 2016-12-10 LAB — OB RESULTS CONSOLE ABO/RH: RH Type: POSITIVE

## 2016-12-10 LAB — OB RESULTS CONSOLE GC/CHLAMYDIA
CHLAMYDIA, DNA PROBE: NEGATIVE
Gonorrhea: NEGATIVE

## 2016-12-10 LAB — OB RESULTS CONSOLE ANTIBODY SCREEN: Antibody Screen: NEGATIVE

## 2016-12-10 LAB — OB RESULTS CONSOLE RUBELLA ANTIBODY, IGM: RUBELLA: IMMUNE

## 2016-12-10 LAB — OB RESULTS CONSOLE HEPATITIS B SURFACE ANTIGEN: HEP B S AG: NEGATIVE

## 2016-12-10 LAB — OB RESULTS CONSOLE HIV ANTIBODY (ROUTINE TESTING): HIV: NONREACTIVE

## 2016-12-10 LAB — OB RESULTS CONSOLE RPR: RPR: NONREACTIVE

## 2016-12-11 DIAGNOSIS — Z3491 Encounter for supervision of normal pregnancy, unspecified, first trimester: Secondary | ICD-10-CM | POA: Diagnosis not present

## 2016-12-11 DIAGNOSIS — Z3480 Encounter for supervision of other normal pregnancy, unspecified trimester: Secondary | ICD-10-CM | POA: Diagnosis not present

## 2016-12-11 DIAGNOSIS — R69 Illness, unspecified: Secondary | ICD-10-CM | POA: Diagnosis not present

## 2016-12-25 DIAGNOSIS — Z3682 Encounter for antenatal screening for nuchal translucency: Secondary | ICD-10-CM | POA: Diagnosis not present

## 2016-12-25 DIAGNOSIS — O09521 Supervision of elderly multigravida, first trimester: Secondary | ICD-10-CM | POA: Diagnosis not present

## 2017-01-25 DIAGNOSIS — J029 Acute pharyngitis, unspecified: Secondary | ICD-10-CM | POA: Diagnosis not present

## 2017-02-07 DIAGNOSIS — Z3492 Encounter for supervision of normal pregnancy, unspecified, second trimester: Secondary | ICD-10-CM | POA: Diagnosis not present

## 2017-02-07 DIAGNOSIS — Z363 Encounter for antenatal screening for malformations: Secondary | ICD-10-CM | POA: Diagnosis not present

## 2017-03-11 DIAGNOSIS — Z3400 Encounter for supervision of normal first pregnancy, unspecified trimester: Secondary | ICD-10-CM | POA: Diagnosis not present

## 2017-03-11 DIAGNOSIS — Z23 Encounter for immunization: Secondary | ICD-10-CM | POA: Diagnosis not present

## 2017-04-04 DIAGNOSIS — O321XX0 Maternal care for breech presentation, not applicable or unspecified: Secondary | ICD-10-CM | POA: Diagnosis not present

## 2017-04-04 DIAGNOSIS — Z23 Encounter for immunization: Secondary | ICD-10-CM | POA: Diagnosis not present

## 2017-04-04 DIAGNOSIS — Z3400 Encounter for supervision of normal first pregnancy, unspecified trimester: Secondary | ICD-10-CM | POA: Diagnosis not present

## 2017-04-04 DIAGNOSIS — Z3A26 26 weeks gestation of pregnancy: Secondary | ICD-10-CM | POA: Diagnosis not present

## 2017-04-24 ENCOUNTER — Encounter: Payer: Self-pay | Admitting: Registered"

## 2017-04-24 ENCOUNTER — Encounter: Payer: Managed Care, Other (non HMO) | Attending: Obstetrics and Gynecology | Admitting: Registered"

## 2017-04-24 DIAGNOSIS — O99810 Abnormal glucose complicating pregnancy: Secondary | ICD-10-CM | POA: Insufficient documentation

## 2017-04-24 DIAGNOSIS — R7309 Other abnormal glucose: Secondary | ICD-10-CM

## 2017-04-24 NOTE — Progress Notes (Signed)
Patient was seen on 04/24/2017 for Gestational Diabetes self-management class at the [facility]. The following learning objectives were met by the patient during this course:   States the definition of Gestational Diabetes  States why dietary management is important in controlling blood glucose  Describes the effects each nutrient has on blood glucose levels  Demonstrates ability to create a balanced meal plan  Demonstrates carbohydrate counting   States when to check blood glucose levels  Demonstrates proper blood glucose monitoring techniques  States the effect of stress and exercise on blood glucose levels  States the importance of limiting caffeine and abstaining from alcohol and smoking  Blood glucose monitor given: [employer] Lot # [id number] X Exp: 08/02/18 Blood glucose reading: 84  Patient instructed to monitor glucose levels: FBS: 60 - <95 1 hour: <140 2 hour: <120  Patient received handouts:  Nutrition Diabetes and Pregnancy  Carbohydrate Counting List  Patient will be seen for follow-up as needed.

## 2017-05-16 DIAGNOSIS — O09523 Supervision of elderly multigravida, third trimester: Secondary | ICD-10-CM | POA: Diagnosis not present

## 2017-05-16 DIAGNOSIS — O24415 Gestational diabetes mellitus in pregnancy, controlled by oral hypoglycemic drugs: Secondary | ICD-10-CM | POA: Diagnosis not present

## 2017-05-16 DIAGNOSIS — Z3A32 32 weeks gestation of pregnancy: Secondary | ICD-10-CM | POA: Diagnosis not present

## 2017-05-16 DIAGNOSIS — Z3493 Encounter for supervision of normal pregnancy, unspecified, third trimester: Secondary | ICD-10-CM | POA: Diagnosis not present

## 2017-05-16 DIAGNOSIS — O10013 Pre-existing essential hypertension complicating pregnancy, third trimester: Secondary | ICD-10-CM | POA: Diagnosis not present

## 2017-05-17 ENCOUNTER — Encounter (HOSPITAL_COMMUNITY): Payer: Self-pay | Admitting: *Deleted

## 2017-05-17 ENCOUNTER — Inpatient Hospital Stay (HOSPITAL_COMMUNITY)
Admission: AD | Admit: 2017-05-17 | Discharge: 2017-05-17 | Disposition: A | Discharge status: Home / Self Care | Payer: Managed Care, Other (non HMO) | Source: Ambulatory Visit | Attending: Obstetrics and Gynecology | Admitting: Obstetrics and Gynecology

## 2017-05-17 DIAGNOSIS — Z7982 Long term (current) use of aspirin: Secondary | ICD-10-CM | POA: Insufficient documentation

## 2017-05-17 DIAGNOSIS — Z7984 Long term (current) use of oral hypoglycemic drugs: Secondary | ICD-10-CM | POA: Insufficient documentation

## 2017-05-17 DIAGNOSIS — O10013 Pre-existing essential hypertension complicating pregnancy, third trimester: Secondary | ICD-10-CM | POA: Diagnosis not present

## 2017-05-17 DIAGNOSIS — Z79899 Other long term (current) drug therapy: Secondary | ICD-10-CM | POA: Diagnosis not present

## 2017-05-17 DIAGNOSIS — O99013 Anemia complicating pregnancy, third trimester: Secondary | ICD-10-CM | POA: Diagnosis not present

## 2017-05-17 DIAGNOSIS — K219 Gastro-esophageal reflux disease without esophagitis: Secondary | ICD-10-CM | POA: Insufficient documentation

## 2017-05-17 DIAGNOSIS — N939 Abnormal uterine and vaginal bleeding, unspecified: Secondary | ICD-10-CM | POA: Diagnosis present

## 2017-05-17 DIAGNOSIS — O23593 Infection of other part of genital tract in pregnancy, third trimester: Secondary | ICD-10-CM | POA: Diagnosis not present

## 2017-05-17 DIAGNOSIS — O99613 Diseases of the digestive system complicating pregnancy, third trimester: Secondary | ICD-10-CM | POA: Insufficient documentation

## 2017-05-17 DIAGNOSIS — D649 Anemia, unspecified: Secondary | ICD-10-CM | POA: Insufficient documentation

## 2017-05-17 DIAGNOSIS — Z3A32 32 weeks gestation of pregnancy: Secondary | ICD-10-CM | POA: Diagnosis not present

## 2017-05-17 DIAGNOSIS — B9689 Other specified bacterial agents as the cause of diseases classified elsewhere: Secondary | ICD-10-CM | POA: Insufficient documentation

## 2017-05-17 DIAGNOSIS — N760 Acute vaginitis: Secondary | ICD-10-CM | POA: Insufficient documentation

## 2017-05-17 HISTORY — DX: Anemia, unspecified: D64.9

## 2017-05-17 HISTORY — DX: Gastro-esophageal reflux disease without esophagitis: K21.9

## 2017-05-17 HISTORY — DX: Cardiac murmur, unspecified: R01.1

## 2017-05-17 LAB — COMPREHENSIVE METABOLIC PANEL
ALBUMIN: 3.3 g/dL — AB (ref 3.5–5.0)
ALT: 32 U/L (ref 14–54)
AST: 29 U/L (ref 15–41)
Alkaline Phosphatase: 91 U/L (ref 38–126)
Anion gap: 10 (ref 5–15)
BUN: 15 mg/dL (ref 6–20)
CHLORIDE: 103 mmol/L (ref 101–111)
CO2: 21 mmol/L — AB (ref 22–32)
CREATININE: 0.44 mg/dL (ref 0.44–1.00)
Calcium: 9.2 mg/dL (ref 8.9–10.3)
GFR calc Af Amer: >60 mL/min (ref >60)
GLUCOSE: 92 mg/dL (ref 65–99)
Potassium: 3.7 mmol/L (ref 3.5–5.1)
SODIUM: 134 mmol/L — AB (ref 135–145)
Total Bilirubin: 0.5 mg/dL (ref 0.3–1.2)
Total Protein: 7.1 g/dL (ref 6.5–8.1)

## 2017-05-17 LAB — WET PREP, GENITAL
SPERM: NONE SEEN
Trich, Wet Prep: NONE SEEN
Yeast Wet Prep HPF POC: NONE SEEN

## 2017-05-17 LAB — CBC
HCT: 35.3 % — ABNORMAL LOW (ref 36.0–46.0)
Hemoglobin: 11.7 g/dL — ABNORMAL LOW (ref 12.0–15.0)
MCH: 31.4 pg (ref 26.0–34.0)
MCHC: 33.1 g/dL (ref 30.0–36.0)
MCV: 94.6 fL (ref 78.0–100.0)
PLATELETS: 271 10*3/uL (ref 150–400)
RBC: 3.73 MIL/uL — AB (ref 3.87–5.11)
RDW: 13.1 % (ref 11.5–15.5)
WBC: 10 10*3/uL (ref 4.0–10.5)

## 2017-05-17 LAB — URINALYSIS, ROUTINE W REFLEX MICROSCOPIC
Bilirubin Urine: NEGATIVE
GLUCOSE, UA: NEGATIVE mg/dL
KETONES UR: NEGATIVE mg/dL
NITRITE: NEGATIVE
PH: 6 (ref 5.0–8.0)
Protein, ur: NEGATIVE mg/dL
Specific Gravity, Urine: 1.006 (ref 1.005–1.030)

## 2017-05-17 LAB — PROTEIN / CREATININE RATIO, URINE
Creatinine, Urine: 33 mg/dL
Total Protein, Urine: <6 mg/dL

## 2017-05-17 MED ORDER — LABETALOL HCL 100 MG PO TABS
200.0000 mg | ORAL_TABLET | Freq: Once | ORAL | Status: AC
  Administered 2017-05-17: 200 mg via ORAL
  Filled 2017-05-17: qty 2

## 2017-05-17 MED ORDER — METRONIDAZOLE 500 MG PO TABS: 500.0000 mg | ORAL_TABLET | Freq: Two times a day (BID) | ORAL | 0 refills | Status: DC

## 2017-05-17 NOTE — Discharge Instructions (Signed)
Bacterial Vaginosis Bacterial vaginosis is a vaginal infection that occurs when the normal balance of bacteria in the vagina is disrupted. It results from an overgrowth of certain bacteria. This is the most common vaginal infection among women ages 15-44. Because bacterial vaginosis increases your risk for STIs (sexually transmitted infections), getting treated can help reduce your risk for chlamydia, gonorrhea, herpes, and HIV (human immunodeficiency virus). Treatment is also important for preventing complications in pregnant women, because this condition can cause an early (premature) delivery. What are the causes? This condition is caused by an increase in harmful bacteria that are normally present in small amounts in the vagina. However, the reason that the condition develops is not fully understood. What increases the risk? The following factors may make you more likely to develop this condition:  Having a new sexual partner or multiple sexual partners.  Having unprotected sex.  Douching.  Having an intrauterine device (IUD).  Smoking.  Drug and alcohol abuse.  Taking certain antibiotic medicines.  Being pregnant.  You cannot get bacterial vaginosis from toilet seats, bedding, swimming pools, or contact with objects around you. What are the signs or symptoms? Symptoms of this condition include:  Grey or white vaginal discharge. The discharge can also be watery or foamy.  A fish-like odor with discharge, especially after sexual intercourse or during menstruation.  Itching in and around the vagina.  Burning or pain with urination.  Some women with bacterial vaginosis have no signs or symptoms. How is this diagnosed? This condition is diagnosed based on:  Your medical history.  A physical exam of the vagina.  Testing a sample of vaginal fluid under a microscope to look for a large amount of bad bacteria or abnormal cells. Your health care provider may use a cotton swab  or a small wooden spatula to collect the sample.  How is this treated? This condition is treated with antibiotics. These may be given as a pill, a vaginal cream, or a medicine that is put into the vagina (suppository). If the condition comes back after treatment, a second round of antibiotics may be needed. Follow these instructions at home: Medicines  Take over-the-counter and prescription medicines only as told by your health care provider.  Take or use your antibiotic as told by your health care provider. Do not stop taking or using the antibiotic even if you start to feel better. General instructions  If you have a female sexual partner, tell her that you have a vaginal infection. She should see her health care provider and be treated if she has symptoms. If you have a female sexual partner, he does not need treatment.  During treatment: ? Avoid sexual activity until you finish treatment. ? Do not douche. ? Avoid alcohol as directed by your health care provider. ? Avoid breastfeeding as directed by your health care provider.  Drink enough water and fluids to keep your urine clear or pale yellow.  Keep the area around your vagina and rectum clean. ? Wash the area daily with warm water. ? Wipe yourself from front to back after using the toilet.  Keep all follow-up visits as told by your health care provider. This is important. How is this prevented?  Do not douche.  Wash the outside of your vagina with warm water only.  Use protection when having sex. This includes latex condoms and dental dams.  Limit how many sexual partners you have. To help prevent bacterial vaginosis, it is best to have sex with just   one partner (monogamous).  Make sure you and your sexual partner are tested for STIs.  Wear cotton or cotton-lined underwear.  Avoid wearing tight pants and pantyhose, especially during summer.  Limit the amount of alcohol that you drink.  Do not use any products that  contain nicotine or tobacco, such as cigarettes and e-cigarettes. If you need help quitting, ask your health care provider.  Do not use illegal drugs. Where to find more information:  Centers for Disease Control and Prevention: www.cdc.gov/std  American Sexual Health Association (ASHA): www.ashastd.org  U.S. Department of Health and Human Services, Office on Women's Health: www.womenshealth.gov/ or https://www.womenshealth.gov/a-z-topics/bacterial-vaginosis Contact a health care provider if:  Your symptoms do not improve, even after treatment.  You have more discharge or pain when urinating.  You have a fever.  You have pain in your abdomen.  You have pain during sex.  You have vaginal bleeding between periods. Summary  Bacterial vaginosis is a vaginal infection that occurs when the normal balance of bacteria in the vagina is disrupted.  Because bacterial vaginosis increases your risk for STIs (sexually transmitted infections), getting treated can help reduce your risk for chlamydia, gonorrhea, herpes, and HIV (human immunodeficiency virus). Treatment is also important for preventing complications in pregnant women, because the condition can cause an early (premature) delivery.  This condition is treated with antibiotic medicines. These may be given as a pill, a vaginal cream, or a medicine that is put into the vagina (suppository). This information is not intended to replace advice given to you by your health care provider. Make sure you discuss any questions you have with your health care provider. Document Released: 05/21/2005 Document Revised: 02/04/2016 Document Reviewed: 02/04/2016 Elsevier Interactive Patient Education  2017 Elsevier Inc.  

## 2017-05-17 NOTE — MAU Provider Note (Signed)
History     CSN: [id number]  Arrival date and time: 05/17/17 [id number]   First Provider Initiated Contact with Patient 05/17/17 2013      Chief Complaint  Patient presents with  . Vaginal Bleeding   HPI Anita Hall 36 y.o. 5980w5d  Comes to MAU as she saw pink spotting today when she was wiping.  She has chronic hypertension and is currently on Labetalol 200 mg PO BID.  She has not yet taken her evening dose.  Yesterday in the office her BP was approx. 140/90.   OB History    Gravida Para Term Preterm AB Living   3 2 1 1   2    SAB TAB Ectopic Multiple Live Births         1 1      Past Medical History:  Diagnosis Date  . Anemia   . Diabetes mellitus without complication (HCC)    gestational  . GERD (gastroesophageal reflux disease)   . Heart murmur    born with one but told it was gone  . Hypertension    chronic    Past Surgical History:  Procedure Laterality Date  . CESAREAN SECTION    . CESAREAN SECTION N/A 08/13/2012   Procedure: CESAREAN SECTION;  Surgeon: Meriel Picaichard M Holland, MD;  Location: [facility];  Service: Obstetrics;  Laterality: N/A;  Repeat edc 09/03/12  . FOOT SURGERY  2000  . WISDOM TOOTH EXTRACTION      Family History  Problem Relation Age of Onset  . Hypertension Maternal Grandmother   . Diabetes Maternal Grandmother   . Cancer Maternal Grandmother   . Hypertension Maternal Grandfather   . Heart disease Maternal Grandfather   . Hypertension Paternal Grandmother     Social History   Tobacco Use  . Smoking status: Never Smoker  . Smokeless tobacco: Never Used  Substance Use Topics  . Alcohol use: No    Comment: not while pregnant  . Drug use: No    Allergies: No Known Allergies  Medications Prior to Admission  Medication Sig Dispense Refill Last Dose  . aspirin EC 81 MG tablet Take 81 mg by mouth daily.   05/16/2017 at Unknown time  . ferrous sulfate 325 (65 FE) MG EC tablet Take 325 mg by mouth 3 (three) times daily with meals.    05/17/2017 at Unknown time  . folic acid (FOLVITE) 800 MCG tablet Take 400 mcg by mouth daily.   05/16/2017 at Unknown time  . glyBURIDE (DIABETA) 2.5 MG tablet Take 2.5 mg by mouth daily with breakfast.   05/17/2017 at Unknown time  . labetalol (NORMODYNE) 100 MG tablet Take 100 mg by mouth 2 (two) times daily.   05/17/2017 at Unknown time  . Prenatal Vit-Fe Fumarate-FA (PRENATAL MULTIVITAMIN) TABS Take 1 tablet by mouth daily at 12 noon.   05/16/2017 at Unknown time  . ranitidine (ZANTAC) 75 MG tablet Take 150 mg by mouth at bedtime as needed.    05/16/2017 at Unknown time  . acetaminophen (TYLENOL) 500 MG tablet Take 1,000 mg by mouth every 6 (six) hours as needed for pain (For headahce.).   More than a month at Unknown time  . oxyCODONE-acetaminophen (ROXICET) 5-325 MG per tablet Take 1 tablet by mouth every 4 (four) hours as needed for pain. 40 tablet 0 More than a month at Unknown time    Review of Systems  Constitutional: Negative for fever.  Gastrointestinal: Negative for abdominal pain, nausea and vomiting.  Genitourinary: Positive  for vaginal bleeding. Negative for dysuria and vaginal discharge.   Physical Exam   Blood pressure (!) 179/89, pulse 87, temperature 98.5 F (36.9 C), resp. rate 18, height 5' (1.524 m), weight 160 lb (72.6 kg), unknown if currently breastfeeding.  Physical Exam  Nursing note and vitals reviewed. Constitutional: She is oriented to person, place, and time. She appears well-developed and well-nourished.  HENT:  Head: Normocephalic.  Eyes: EOM are normal.  Neck: Neck supple.  GI: Soft. There is no tenderness. There is no rebound and no guarding.  FHT baseline is 140 with moderate variability.  Many accelerations 15x15 - baby moving well.  Occasional contractions.  Reactive strip - no decelerations noted.  Genitourinary:  Genitourinary Comments: Speculum exam: Vagina - Mod amount of creamy, frothy discharge, no odor, one small pinpoint spot of blood  seen in vaginal discharge Cervix - No contact bleeding Bimanual exam: Cervix sofr and closed - presenting part not in the pelvis GC/Chlam, wet prep done Chaperone present for exam.   Musculoskeletal: Normal range of motion.  Neurological: She is alert and oriented to person, place, and time.  Skin: Skin is warm and dry.  Psychiatric: She has a normal mood and affect.    MAU Course  Procedures Results for orders placed or performed during the hospital encounter of 05/17/17 (from the past 24 hour(s))  Urinalysis, Routine w reflex microscopic     Status: Abnormal   Collection Time: 05/17/17  7:30 PM  Result Value Ref Range   Color, Urine YELLOW YELLOW   APPearance HAZY (A) CLEAR   Specific Gravity, Urine 1.006 1.005 - 1.030   pH 6.0 5.0 - 8.0   Glucose, UA NEGATIVE NEGATIVE mg/dL   Hgb urine dipstick MODERATE (A) NEGATIVE   Bilirubin Urine NEGATIVE NEGATIVE   Ketones, ur NEGATIVE NEGATIVE mg/dL   Protein, ur NEGATIVE NEGATIVE mg/dL   Nitrite NEGATIVE NEGATIVE   Leukocytes, UA LARGE (A) NEGATIVE   RBC / HPF 0-5 0 - 5 RBC/hpf   WBC, UA 6-30 0 - 5 WBC/hpf   Bacteria, UA RARE (A) NONE SEEN   Squamous Epithelial / LPF 6-30 (A) NONE SEEN  Wet prep, genital     Status: Abnormal   Collection Time: 05/17/17  8:55 PM  Result Value Ref Range   Yeast Wet Prep HPF POC NONE SEEN NONE SEEN   Trich, Wet Prep NONE SEEN NONE SEEN   Clue Cells Wet Prep HPF POC PRESENT (A) NONE SEEN   WBC, Wet Prep HPF POC TOO NUMEROUS TO COUNT (A) NONE SEEN   Sperm NONE SEEN   CBC     Status: Abnormal   Collection Time: 05/17/17  9:24 PM  Result Value Ref Range   WBC 10.0 4.0 - 10.5 K/uL   RBC 3.73 (L) 3.87 - 5.11 MIL/uL   Hemoglobin 11.7 (L) 12.0 - 15.0 g/dL   HCT 09.835.3 (L) 11.936.0 - 14.746.0 %   MCV 94.6 78.0 - 100.0 fL   MCH 31.4 26.0 - 34.0 pg   MCHC 33.1 30.0 - 36.0 g/dL   RDW 82.913.1 56.211.5 - 13.015.5 %   Platelets 271 150 - 400 K/uL    MDM Consult with Dr. Rana SnareLowe - BP labile - serial BPs in progress.  Very high  when sitting up, less when lying down on her side.  One dose of Labetalol 200 mg given in MAU as client had not taken her evening dose of medication yet.  Perhaps client will need Labatelol 200 mg PO  TID if BP continues to be labile.  Client to keep appointment in the office on Monday and have BP checked when she comes for NST.  PIH labs done - Care assumed by Dr. Rachelle Hora at 2130  Assessment and Plan  Chronic hypertension on medication but BP elevated at times - [facility] labs dome Bacterial vaginosis - likely was the cause of vaginal bleeding that she saw    Currie Paris 05/17/2017, 8:36 PM   Addendum:  Patient's cbc, cmp, and pr/cr wnl. Blood pressure normal now as well. Discharge home in stable condition. Treat BV as outpatient. Continue labetolol BID at this time. Keep outpatient follow up appointment on Monday.  Rolm Bookbinder, DO

## 2017-05-17 NOTE — MAU Note (Signed)
Went to BR this afternoon and light pink tinge on toilet paper. Took nap and when went to BR again saw more pink on tissue. Some tightness in lower abd but no pain. Chronic HTN on Labetalol 200mg  BID. Has not taken evening dose

## 2017-05-19 LAB — CULTURE, OB URINE: Culture: NO GROWTH

## 2017-05-20 DIAGNOSIS — Z3400 Encounter for supervision of normal first pregnancy, unspecified trimester: Secondary | ICD-10-CM | POA: Diagnosis not present

## 2017-05-20 DIAGNOSIS — Z3A33 33 weeks gestation of pregnancy: Secondary | ICD-10-CM | POA: Diagnosis not present

## 2017-05-20 DIAGNOSIS — O9989 Other specified diseases and conditions complicating pregnancy, childbirth and the puerperium: Secondary | ICD-10-CM | POA: Diagnosis not present

## 2017-05-20 LAB — GC/CHLAMYDIA PROBE AMP (~~LOC~~) NOT AT ARMC
CHLAMYDIA, DNA PROBE: NEGATIVE
Neisseria Gonorrhea: NEGATIVE

## 2017-05-23 DIAGNOSIS — Z3A33 33 weeks gestation of pregnancy: Secondary | ICD-10-CM | POA: Diagnosis not present

## 2017-05-23 DIAGNOSIS — Z3400 Encounter for supervision of normal first pregnancy, unspecified trimester: Secondary | ICD-10-CM | POA: Diagnosis not present

## 2017-05-23 DIAGNOSIS — O368330 Maternal care for abnormalities of the fetal heart rate or rhythm, third trimester, not applicable or unspecified: Secondary | ICD-10-CM | POA: Diagnosis not present

## 2017-05-27 DIAGNOSIS — Z3493 Encounter for supervision of normal pregnancy, unspecified, third trimester: Secondary | ICD-10-CM | POA: Diagnosis not present

## 2017-05-27 DIAGNOSIS — Z3A34 34 weeks gestation of pregnancy: Secondary | ICD-10-CM | POA: Diagnosis not present

## 2017-05-27 DIAGNOSIS — O24419 Gestational diabetes mellitus in pregnancy, unspecified control: Secondary | ICD-10-CM | POA: Diagnosis not present

## 2017-05-30 DIAGNOSIS — Z3493 Encounter for supervision of normal pregnancy, unspecified, third trimester: Secondary | ICD-10-CM | POA: Diagnosis not present

## 2017-05-30 DIAGNOSIS — Z3A34 34 weeks gestation of pregnancy: Secondary | ICD-10-CM | POA: Diagnosis not present

## 2017-05-30 DIAGNOSIS — O368330 Maternal care for abnormalities of the fetal heart rate or rhythm, third trimester, not applicable or unspecified: Secondary | ICD-10-CM | POA: Diagnosis not present

## 2017-05-30 NOTE — Consult Note (Signed)
NAMBenedict Needy:  Anita Hall, Anita Hall                ACCOUNT NO.:  [id number]  MEDICAL RECORD NO.:  [id number]  LOCATION:                                 FACILITY:  PHYSICIAN:  Duke Salviaichard M. Marcelle OverlieHolland, M.D.DATE OF BIRTH:  December 31, 1980  DATE OF CONSULTATION: DATE OF DISCHARGE:                                CONSULTATION   CHIEF COMPLAINT:  Repeat cesarean section at term, history of prior C- section x2, history of hypertension, preeclampsia, and prior history of gestational diabetes.  HISTORY OF PRESENT ILLNESS:  A 36 year old, G5, P 1-1-2-2, EDD February 3, presents for RCS with a history of chronic hypertension, currently on labetalol.  She has had serial normal growth scans with normal fluid and reactive NST.  This procedure including specific risks related to bleeding, infection, blood transfusion, phlebitis along with her expected recovery time discussed, which she understands and accepts.  PAST MEDICAL HISTORY:  ALLERGIES:  None.  SURGICAL HISTORY:  Cesarean section, 2009 primary for fetal distress and preeclampsia.  In 2014, repeat C-section at 37 weeks.  FAMILY AND SOCIAL HISTORY:  Please see the [facility] form for details.  PHYSICAL EXAM:  VITAL SIGNS:  Temp 98.2, blood pressure 130/88. HEENT:  Unremarkable. NECK:  Supple.  No masses. LUNGS:  Clear. CARDIOVASCULAR:  Regular rate and rhythm without murmurs, rubs, or gallops. BREASTS:  Not examined.  37 cm fundal height.  Fetal heart rate 140. Cervix is closed. EXTREMITIES:  1+ edema.  Reflexes 1 to 2+.  No clonus.  IMPRESSION: 1. Thirty-eight week intrauterine pregnancy. 2. Previous cesarean section. 3. Gestational diabetes, controlled on glyburide. 4. Chronic hypertension, on labetalol.  PLAN:  Repeat cesarean section.  Procedure and risks discussed as above.     Tyquez Hollibaugh M. Marcelle OverlieHolland, M.D.   ______________________________ Duke Salviaichard M. Marcelle OverlieHolland, M.D.    RM[facility]/MEDQ  D:  05/30/2017  T:  05/30/2017  Job:  [id number]

## 2017-05-30 NOTE — H&P (Signed)
Anita BareSarah E Hall  DICTATION # [id number] CSN# [id number]   [name], MD 05/30/2017 9:10 AM

## 2017-05-31 DIAGNOSIS — Z3A34 34 weeks gestation of pregnancy: Secondary | ICD-10-CM | POA: Diagnosis not present

## 2017-05-31 DIAGNOSIS — O133 Gestational [pregnancy-induced] hypertension without significant proteinuria, third trimester: Secondary | ICD-10-CM | POA: Diagnosis not present

## 2017-05-31 DIAGNOSIS — Z3493 Encounter for supervision of normal pregnancy, unspecified, third trimester: Secondary | ICD-10-CM | POA: Diagnosis not present

## 2017-05-31 DIAGNOSIS — O24410 Gestational diabetes mellitus in pregnancy, diet controlled: Secondary | ICD-10-CM | POA: Diagnosis not present

## 2017-06-03 DIAGNOSIS — Z3493 Encounter for supervision of normal pregnancy, unspecified, third trimester: Secondary | ICD-10-CM | POA: Diagnosis not present

## 2017-06-03 DIAGNOSIS — O24410 Gestational diabetes mellitus in pregnancy, diet controlled: Secondary | ICD-10-CM | POA: Diagnosis not present

## 2017-06-03 DIAGNOSIS — Z3A35 35 weeks gestation of pregnancy: Secondary | ICD-10-CM | POA: Diagnosis not present

## 2017-06-03 DIAGNOSIS — O133 Gestational [pregnancy-induced] hypertension without significant proteinuria, third trimester: Secondary | ICD-10-CM | POA: Diagnosis not present

## 2017-06-06 DIAGNOSIS — O133 Gestational [pregnancy-induced] hypertension without significant proteinuria, third trimester: Secondary | ICD-10-CM | POA: Diagnosis not present

## 2017-06-06 DIAGNOSIS — Z3493 Encounter for supervision of normal pregnancy, unspecified, third trimester: Secondary | ICD-10-CM | POA: Diagnosis not present

## 2017-06-06 DIAGNOSIS — O24419 Gestational diabetes mellitus in pregnancy, unspecified control: Secondary | ICD-10-CM | POA: Diagnosis not present

## 2017-06-06 DIAGNOSIS — Z3A35 35 weeks gestation of pregnancy: Secondary | ICD-10-CM | POA: Diagnosis not present

## 2017-06-10 ENCOUNTER — Encounter (HOSPITAL_COMMUNITY): Payer: Self-pay

## 2017-06-10 DIAGNOSIS — O9989 Other specified diseases and conditions complicating pregnancy, childbirth and the puerperium: Secondary | ICD-10-CM | POA: Diagnosis not present

## 2017-06-10 DIAGNOSIS — Z3400 Encounter for supervision of normal first pregnancy, unspecified trimester: Secondary | ICD-10-CM | POA: Diagnosis not present

## 2017-06-10 DIAGNOSIS — Z3A36 36 weeks gestation of pregnancy: Secondary | ICD-10-CM | POA: Diagnosis not present

## 2017-06-11 ENCOUNTER — Telehealth (HOSPITAL_COMMUNITY): Payer: Self-pay | Admitting: *Deleted

## 2017-06-11 NOTE — Telephone Encounter (Signed)
Preadmission screen  

## 2017-06-12 ENCOUNTER — Encounter (HOSPITAL_COMMUNITY): Payer: Self-pay

## 2017-06-13 DIAGNOSIS — O9989 Other specified diseases and conditions complicating pregnancy, childbirth and the puerperium: Secondary | ICD-10-CM | POA: Diagnosis not present

## 2017-06-13 DIAGNOSIS — Z3400 Encounter for supervision of normal first pregnancy, unspecified trimester: Secondary | ICD-10-CM | POA: Diagnosis not present

## 2017-06-17 DIAGNOSIS — Z3493 Encounter for supervision of normal pregnancy, unspecified, third trimester: Secondary | ICD-10-CM | POA: Diagnosis not present

## 2017-06-17 DIAGNOSIS — Z3A37 37 weeks gestation of pregnancy: Secondary | ICD-10-CM | POA: Diagnosis not present

## 2017-06-17 DIAGNOSIS — O133 Gestational [pregnancy-induced] hypertension without significant proteinuria, third trimester: Secondary | ICD-10-CM | POA: Diagnosis not present

## 2017-06-19 NOTE — Patient Instructions (Signed)
Anita BareSarah E Hall  06/19/2017   Your procedure is scheduled on:  06/21/2017  [facility].  Pick up the phone at the desk and dial [phone]  Call this number if you have problems the morning of surgery:[phone]  Remember:   Do not eat food:After Midnight.  Do not drink clear liquids: After Midnight.  Take these medicines the morning of surgery with A SIP OF WATER: take your labetalol as normal.  You may take your zantac if you wish to.  Take your bedtime glyburide as usual.   Do not wear jewelry, make-up or nail polish.  Do not wear lotions, powders, or perfumes. Do not wear deodorant.  Do not shave 48 hours prior to surgery.  Do not bring valuables to the hospital.  [facility] not   responsible for any belongings or valuables brought to the hospital.  Contacts, dentures or bridgework may not be worn into surgery.  Leave suitcase in the car. After surgery it may be brought to your room.  For patients admitted to the hospital, checkout time is 11:00 AM the day of              discharge.    N/A   Please read over the following fact sheets that you were given:   Surgical Site Infection Prevention

## 2017-06-20 ENCOUNTER — Encounter (HOSPITAL_COMMUNITY)
Admission: RE | Admit: 2017-06-20 | Discharge: 2017-06-20 | Disposition: A | Discharge status: Home / Self Care | Payer: 59 | Source: Ambulatory Visit | Attending: Obstetrics and Gynecology | Admitting: Obstetrics and Gynecology

## 2017-06-20 DIAGNOSIS — O133 Gestational [pregnancy-induced] hypertension without significant proteinuria, third trimester: Secondary | ICD-10-CM | POA: Diagnosis not present

## 2017-06-20 DIAGNOSIS — Z3493 Encounter for supervision of normal pregnancy, unspecified, third trimester: Secondary | ICD-10-CM | POA: Diagnosis not present

## 2017-06-20 DIAGNOSIS — Z3A37 37 weeks gestation of pregnancy: Secondary | ICD-10-CM | POA: Diagnosis not present

## 2017-06-20 HISTORY — DX: Gestational diabetes mellitus in pregnancy, unspecified control: O24.419

## 2017-06-20 HISTORY — DX: Supervision of elderly multigravida, unspecified trimester: O09.529

## 2017-06-20 LAB — CBC
HCT: 36.7 % (ref 36.0–46.0)
Hemoglobin: 12.6 g/dL (ref 12.0–15.0)
MCH: 31.7 pg (ref 26.0–34.0)
MCHC: 34.3 g/dL (ref 30.0–36.0)
MCV: 92.2 fL (ref 78.0–100.0)
PLATELETS: 223 10*3/uL (ref 150–400)
RBC: 3.98 MIL/uL (ref 3.87–5.11)
RDW: 13.3 % (ref 11.5–15.5)
WBC: 8 10*3/uL (ref 4.0–10.5)

## 2017-06-20 LAB — TYPE AND SCREEN
ABO/RH(D): A POS
Antibody Screen: NEGATIVE

## 2017-06-21 ENCOUNTER — Encounter (HOSPITAL_COMMUNITY)
Admission: AD | Disposition: A | Discharge status: Home / Self Care | Payer: Self-pay | Source: Ambulatory Visit | Attending: Obstetrics and Gynecology

## 2017-06-21 ENCOUNTER — Inpatient Hospital Stay (HOSPITAL_COMMUNITY): Payer: 59 | Admitting: Certified Registered"

## 2017-06-21 ENCOUNTER — Other Ambulatory Visit: Payer: Self-pay

## 2017-06-21 ENCOUNTER — Encounter (HOSPITAL_COMMUNITY): Payer: Self-pay

## 2017-06-21 ENCOUNTER — Inpatient Hospital Stay (HOSPITAL_COMMUNITY)
Admission: AD | Admit: 2017-06-21 | Discharge: 2017-06-23 | DRG: 784 | Disposition: A | Discharge status: Home / Self Care | Payer: 59 | Source: Ambulatory Visit | Attending: Obstetrics and Gynecology | Admitting: Obstetrics and Gynecology

## 2017-06-21 DIAGNOSIS — O1002 Pre-existing essential hypertension complicating childbirth: Secondary | ICD-10-CM | POA: Diagnosis present

## 2017-06-21 DIAGNOSIS — Z3A37 37 weeks gestation of pregnancy: Secondary | ICD-10-CM | POA: Diagnosis not present

## 2017-06-21 DIAGNOSIS — O34211 Maternal care for low transverse scar from previous cesarean delivery: Secondary | ICD-10-CM | POA: Diagnosis not present

## 2017-06-21 DIAGNOSIS — O164 Unspecified maternal hypertension, complicating childbirth: Secondary | ICD-10-CM | POA: Diagnosis not present

## 2017-06-21 DIAGNOSIS — Z3490 Encounter for supervision of normal pregnancy, unspecified, unspecified trimester: Secondary | ICD-10-CM

## 2017-06-21 DIAGNOSIS — O24425 Gestational diabetes mellitus in childbirth, controlled by oral hypoglycemic drugs: Secondary | ICD-10-CM | POA: Diagnosis present

## 2017-06-21 DIAGNOSIS — Z302 Encounter for sterilization: Secondary | ICD-10-CM

## 2017-06-21 DIAGNOSIS — Z3A00 Weeks of gestation of pregnancy not specified: Secondary | ICD-10-CM | POA: Diagnosis not present

## 2017-06-21 LAB — GLUCOSE, CAPILLARY
GLUCOSE-CAPILLARY: 67 mg/dL (ref 65–99)
GLUCOSE-CAPILLARY: 106 mg/dL — AB (ref 65–99)

## 2017-06-21 LAB — RPR: RPR Ser Ql: NONREACTIVE

## 2017-06-21 SURGERY — Surgical Case
Anesthesia: Spinal | Site: Abdomen | Wound class: Clean Contaminated

## 2017-06-21 MED ORDER — DIPHENHYDRAMINE HCL 25 MG PO CAPS: 25.0000 mg | ORAL_CAPSULE | ORAL | Status: DC | PRN

## 2017-06-21 MED ORDER — FLEET ENEMA 7-19 GM/118ML RE ENEM: 1.0000 | ENEMA | Freq: Every day | RECTAL | Status: DC | PRN

## 2017-06-21 MED ORDER — OXYCODONE-ACETAMINOPHEN 5-325 MG PO TABS
1.0000 | ORAL_TABLET | ORAL | Status: DC | PRN
  Administered 2017-06-22: 1 via ORAL
  Filled 2017-06-21: qty 1

## 2017-06-21 MED ORDER — NALBUPHINE HCL 10 MG/ML IJ SOLN: 5.0000 mg | INTRAMUSCULAR | Status: DC | PRN

## 2017-06-21 MED ORDER — DIPHENHYDRAMINE HCL 25 MG PO CAPS: 25.0000 mg | ORAL_CAPSULE | Freq: Four times a day (QID) | ORAL | Status: DC | PRN

## 2017-06-21 MED ORDER — SCOPOLAMINE 1 MG/3DAYS TD PT72: 1.0000 | MEDICATED_PATCH | Freq: Once | TRANSDERMAL | Status: DC

## 2017-06-21 MED ORDER — OXYTOCIN 10 UNIT/ML IJ SOLN
INTRAMUSCULAR | Status: AC
  Filled 2017-06-21: qty 4

## 2017-06-21 MED ORDER — SIMETHICONE 80 MG PO CHEW: 80.0000 mg | CHEWABLE_TABLET | ORAL | Status: DC | PRN

## 2017-06-21 MED ORDER — SODIUM CHLORIDE 0.9% FLUSH: 3.0000 mL | Freq: Two times a day (BID) | INTRAVENOUS | Status: DC

## 2017-06-21 MED ORDER — KETOROLAC TROMETHAMINE 30 MG/ML IJ SOLN: 30.0000 mg | Freq: Four times a day (QID) | INTRAMUSCULAR | Status: DC | PRN

## 2017-06-21 MED ORDER — PHENYLEPHRINE 8 MG IN D5W 100 ML (0.08MG/ML) PREMIX OPTIME
INJECTION | INTRAVENOUS | Status: DC | PRN
  Administered 2017-06-21: 60 ug/min via INTRAVENOUS

## 2017-06-21 MED ORDER — FENTANYL CITRATE (PF) 100 MCG/2ML IJ SOLN
INTRAMUSCULAR | Status: DC | PRN
  Administered 2017-06-21: 20 ug via INTRATHECAL

## 2017-06-21 MED ORDER — NALOXONE HCL 4 MG/10ML IJ SOLN: 1.0000 ug/kg/h | INTRAVENOUS | Status: DC | PRN

## 2017-06-21 MED ORDER — ACETAMINOPHEN 325 MG PO TABS
650.0000 mg | ORAL_TABLET | ORAL | Status: DC | PRN
  Administered 2017-06-22: 650 mg via ORAL
  Filled 2017-06-21: qty 2

## 2017-06-21 MED ORDER — SENNOSIDES-DOCUSATE SODIUM 8.6-50 MG PO TABS
2.0000 | ORAL_TABLET | ORAL | Status: DC
  Administered 2017-06-22 (×2): 2 via ORAL
  Filled 2017-06-21 (×2): qty 2

## 2017-06-21 MED ORDER — PROMETHAZINE HCL 25 MG/ML IJ SOLN: 6.2500 mg | INTRAMUSCULAR | Status: DC | PRN

## 2017-06-21 MED ORDER — KETOROLAC TROMETHAMINE 30 MG/ML IJ SOLN
INTRAMUSCULAR | Status: AC
  Administered 2017-06-21: 30 mg
  Filled 2017-06-21: qty 1

## 2017-06-21 MED ORDER — BISACODYL 10 MG RE SUPP: 10.0000 mg | Freq: Every day | RECTAL | Status: DC | PRN

## 2017-06-21 MED ORDER — NALBUPHINE HCL 10 MG/ML IJ SOLN
5.0000 mg | INTRAMUSCULAR | Status: DC | PRN
  Administered 2017-06-21 – 2017-06-22 (×2): 5 mg via SUBCUTANEOUS
  Filled 2017-06-21 (×2): qty 1

## 2017-06-21 MED ORDER — ONDANSETRON HCL 4 MG/2ML IJ SOLN
INTRAMUSCULAR | Status: AC
  Filled 2017-06-21: qty 2

## 2017-06-21 MED ORDER — BUPIVACAINE IN DEXTROSE 0.75-8.25 % IT SOLN
INTRATHECAL | Status: DC | PRN
  Administered 2017-06-21: 1.4 mL via INTRATHECAL

## 2017-06-21 MED ORDER — PRENATAL MULTIVITAMIN CH
1.0000 | ORAL_TABLET | Freq: Every day | ORAL | Status: DC
  Administered 2017-06-21 – 2017-06-22 (×2): 1 via ORAL
  Filled 2017-06-21 (×2): qty 1

## 2017-06-21 MED ORDER — ONDANSETRON HCL 4 MG/2ML IJ SOLN
INTRAMUSCULAR | Status: DC | PRN
  Administered 2017-06-21: 4 mg via INTRAVENOUS

## 2017-06-21 MED ORDER — NALOXONE HCL 0.4 MG/ML IJ SOLN: 0.4000 mg | INTRAMUSCULAR | Status: DC | PRN

## 2017-06-21 MED ORDER — LACTATED RINGERS IV SOLN
INTRAVENOUS | Status: DC | PRN
  Administered 2017-06-21: 08:00:00 via INTRAVENOUS

## 2017-06-21 MED ORDER — OXYTOCIN 40 UNITS IN LACTATED RINGERS INFUSION - SIMPLE MED: 2.5000 [IU]/h | INTRAVENOUS | Status: AC

## 2017-06-21 MED ORDER — KETOROLAC TROMETHAMINE 30 MG/ML IJ SOLN
30.0000 mg | Freq: Once | INTRAMUSCULAR | Status: DC | PRN
Start: 2017-06-21 — End: 2017-06-21

## 2017-06-21 MED ORDER — FENTANYL CITRATE (PF) 100 MCG/2ML IJ SOLN
INTRAMUSCULAR | Status: AC
  Filled 2017-06-21: qty 2

## 2017-06-21 MED ORDER — MENTHOL 3 MG MT LOZG: 1.0000 | LOZENGE | OROMUCOSAL | Status: DC | PRN

## 2017-06-21 MED ORDER — ONDANSETRON HCL 4 MG/2ML IJ SOLN: 4.0000 mg | Freq: Three times a day (TID) | INTRAMUSCULAR | Status: DC | PRN

## 2017-06-21 MED ORDER — SIMETHICONE 80 MG PO CHEW
80.0000 mg | CHEWABLE_TABLET | ORAL | Status: DC
  Administered 2017-06-22 (×2): 80 mg via ORAL
  Filled 2017-06-21 (×2): qty 1

## 2017-06-21 MED ORDER — PROPOFOL 10 MG/ML IV BOLUS
INTRAVENOUS | Status: AC
  Filled 2017-06-21: qty 20

## 2017-06-21 MED ORDER — KETOROLAC TROMETHAMINE 60 MG/2ML IM SOLN: 30.0000 mg | Freq: Once | INTRAMUSCULAR | Status: DC

## 2017-06-21 MED ORDER — OXYTOCIN 10 UNIT/ML IJ SOLN
INTRAVENOUS | Status: DC | PRN
  Administered 2017-06-21: 40 [IU] via INTRAVENOUS

## 2017-06-21 MED ORDER — CEFOTETAN DISODIUM-DEXTROSE 2-2.08 GM-%(50ML) IV SOLR
2.0000 g | INTRAVENOUS | Status: AC
  Administered 2017-06-21: 2 g via INTRAVENOUS
  Filled 2017-06-21: qty 50

## 2017-06-21 MED ORDER — MEPERIDINE HCL 25 MG/ML IJ SOLN: 6.2500 mg | INTRAMUSCULAR | Status: DC | PRN

## 2017-06-21 MED ORDER — SCOPOLAMINE 1 MG/3DAYS TD PT72
MEDICATED_PATCH | TRANSDERMAL | Status: DC | PRN
  Administered 2017-06-21: 1 via TRANSDERMAL

## 2017-06-21 MED ORDER — DEXAMETHASONE SODIUM PHOSPHATE 4 MG/ML IJ SOLN
INTRAMUSCULAR | Status: DC | PRN
  Administered 2017-06-21: 4 mg via INTRAVENOUS

## 2017-06-21 MED ORDER — LABETALOL HCL 200 MG PO TABS
200.0000 mg | ORAL_TABLET | Freq: Two times a day (BID) | ORAL | Status: DC
  Administered 2017-06-21 – 2017-06-23 (×4): 200 mg via ORAL
  Filled 2017-06-21 (×4): qty 1

## 2017-06-21 MED ORDER — DIBUCAINE 1 % RE OINT: 1.0000 "application " | TOPICAL_OINTMENT | RECTAL | Status: DC | PRN

## 2017-06-21 MED ORDER — MORPHINE SULFATE (PF) 0.5 MG/ML IJ SOLN
INTRAMUSCULAR | Status: AC
Start: 2017-06-21 — End: 2017-06-21
  Filled 2017-06-21: qty 10

## 2017-06-21 MED ORDER — DEXTROSE IN LACTATED RINGERS 5 % IV SOLN
INTRAVENOUS | Status: DC
  Administered 2017-06-21 – 2017-06-22 (×2): via INTRAVENOUS

## 2017-06-21 MED ORDER — ACETAMINOPHEN 500 MG PO TABS: 1000.0000 mg | ORAL_TABLET | Freq: Four times a day (QID) | ORAL | Status: DC

## 2017-06-21 MED ORDER — IBUPROFEN 800 MG PO TABS
800.0000 mg | ORAL_TABLET | Freq: Three times a day (TID) | ORAL | Status: DC | PRN
  Administered 2017-06-21 – 2017-06-23 (×5): 800 mg via ORAL
  Filled 2017-06-21 (×6): qty 1

## 2017-06-21 MED ORDER — SODIUM CHLORIDE 0.9% FLUSH: 3.0000 mL | INTRAVENOUS | Status: DC | PRN

## 2017-06-21 MED ORDER — SIMETHICONE 80 MG PO CHEW
80.0000 mg | CHEWABLE_TABLET | Freq: Three times a day (TID) | ORAL | Status: DC
  Administered 2017-06-21 – 2017-06-23 (×5): 80 mg via ORAL
  Filled 2017-06-21 (×6): qty 1

## 2017-06-21 MED ORDER — NALBUPHINE HCL 10 MG/ML IJ SOLN: 5.0000 mg | Freq: Once | INTRAMUSCULAR | Status: DC | PRN

## 2017-06-21 MED ORDER — HYDROMORPHONE HCL 1 MG/ML IJ SOLN: 0.2500 mg | INTRAMUSCULAR | Status: DC | PRN

## 2017-06-21 MED ORDER — SODIUM CHLORIDE 0.9 % IR SOLN
Status: DC | PRN
  Administered 2017-06-21: 1000 mL

## 2017-06-21 MED ORDER — MEASLES, MUMPS & RUBELLA VAC ~~LOC~~ INJ
0.5000 mL | INJECTION | Freq: Once | SUBCUTANEOUS | Status: DC
  Filled 2017-06-21: qty 0.5

## 2017-06-21 MED ORDER — MORPHINE SULFATE (PF) 0.5 MG/ML IJ SOLN
INTRAMUSCULAR | Status: DC | PRN
  Administered 2017-06-21: .2 mg via INTRATHECAL

## 2017-06-21 MED ORDER — SODIUM CHLORIDE 0.9% FLUSH
3.0000 mL | INTRAVENOUS | Status: DC | PRN
Start: 2017-06-21 — End: 2017-06-21

## 2017-06-21 MED ORDER — LACTATED RINGERS IV SOLN
INTRAVENOUS | Status: DC
  Administered 2017-06-21: 06:00:00 via INTRAVENOUS

## 2017-06-21 MED ORDER — WITCH HAZEL-GLYCERIN EX PADS: 1.0000 "application " | MEDICATED_PAD | CUTANEOUS | Status: DC | PRN

## 2017-06-21 MED ORDER — SODIUM CHLORIDE 0.9 % IV SOLN: 250.0000 mL | INTRAVENOUS | Status: DC

## 2017-06-21 MED ORDER — OXYCODONE-ACETAMINOPHEN 5-325 MG PO TABS
2.0000 | ORAL_TABLET | ORAL | Status: DC | PRN
  Administered 2017-06-22 (×3): 2 via ORAL
  Filled 2017-06-21 (×3): qty 2

## 2017-06-21 MED ORDER — LACTATED RINGERS IV SOLN
INTRAVENOUS | Status: DC | PRN
  Administered 2017-06-21 (×2): via INTRAVENOUS

## 2017-06-21 MED ORDER — DIPHENHYDRAMINE HCL 50 MG/ML IJ SOLN: 12.5000 mg | INTRAMUSCULAR | Status: DC | PRN

## 2017-06-21 MED ORDER — TETANUS-DIPHTH-ACELL PERTUSSIS 5-2.5-18.5 LF-MCG/0.5 IM SUSP: 0.5000 mL | Freq: Once | INTRAMUSCULAR | Status: DC

## 2017-06-21 MED ORDER — SCOPOLAMINE 1 MG/3DAYS TD PT72
MEDICATED_PATCH | TRANSDERMAL | Status: AC
  Filled 2017-06-21: qty 1

## 2017-06-21 MED ORDER — COCONUT OIL OIL: 1.0000 "application " | TOPICAL_OIL | Status: DC | PRN

## 2017-06-21 MED ORDER — ZOLPIDEM TARTRATE 5 MG PO TABS: 5.0000 mg | ORAL_TABLET | Freq: Every evening | ORAL | Status: DC | PRN

## 2017-06-21 SURGICAL SUPPLY — 33 items
APL SKNCLS STERI-STRIP NONHPOA (GAUZE/BANDAGES/DRESSINGS) ×1
BENZOIN TINCTURE PRP APPL 2/3 (GAUZE/BANDAGES/DRESSINGS) ×2 IMPLANT
CHLORAPREP W/TINT 26ML (MISCELLANEOUS) ×3 IMPLANT
CLAMP CORD UMBIL (MISCELLANEOUS) IMPLANT
CLIP FILSHIE TUBAL LIGA STRL (Clip) ×4 IMPLANT
CLOSURE STERI STRIP 1/2 X4 (GAUZE/BANDAGES/DRESSINGS) ×4 IMPLANT
CLOTH BEACON ORANGE TIMEOUT ST (SAFETY) ×3 IMPLANT
DRSG OPSITE POSTOP 4X10 (GAUZE/BANDAGES/DRESSINGS) ×3 IMPLANT
ELECT REM PT RETURN 9FT ADLT (ELECTROSURGICAL) ×3
ELECTRODE REM PT RTRN 9FT ADLT (ELECTROSURGICAL) ×1 IMPLANT
EXTRACTOR VACUUM M CUP 4 TUBE (SUCTIONS) IMPLANT
EXTRACTOR VACUUM M CUP 4' TUBE (SUCTIONS)
GAUZE SPONGE 4X4 12PLY STRL LF (GAUZE/BANDAGES/DRESSINGS) ×4 IMPLANT
GLOVE BIO SURGEON STRL SZ7 (GLOVE) ×3 IMPLANT
GLOVE BIOGEL PI IND STRL 7.0 (GLOVE) ×2 IMPLANT
GLOVE BIOGEL PI INDICATOR 7.0 (GLOVE) ×4
GOWN STRL REUS W/TWL LRG LVL3 (GOWN DISPOSABLE) ×6 IMPLANT
KIT ABG SYR 3ML LUER SLIP (SYRINGE) IMPLANT
NDL HYPO 25X5/8 SAFETYGLIDE (NEEDLE) ×1 IMPLANT
NEEDLE HYPO 25X5/8 SAFETYGLIDE (NEEDLE) ×3 IMPLANT
NS IRRIG 1000ML POUR BTL (IV SOLUTION) ×3 IMPLANT
PACK C SECTION WH (CUSTOM PROCEDURE TRAY) ×3 IMPLANT
PAD ABD 7.5X8 STRL (GAUZE/BANDAGES/DRESSINGS) ×2 IMPLANT
PAD OB MATERNITY 4.3X12.25 (PERSONAL CARE ITEMS) ×3 IMPLANT
PENCIL SMOKE EVAC W/HOLSTER (ELECTROSURGICAL) ×3 IMPLANT
STRIP CLOSURE SKIN 1/2X4 (GAUZE/BANDAGES/DRESSINGS) ×3 IMPLANT
SUT CHROMIC 0 CTX 36 (SUTURE) ×9 IMPLANT
SUT MON AB 4-0 PS1 27 (SUTURE) ×3 IMPLANT
SUT PDS AB 0 CT1 27 (SUTURE) ×6 IMPLANT
SUT VIC AB 3-0 CT1 27 (SUTURE) ×6
SUT VIC AB 3-0 CT1 TAPERPNT 27 (SUTURE) ×2 IMPLANT
TOWEL OR 17X24 6PK STRL BLUE (TOWEL DISPOSABLE) ×3 IMPLANT
TRAY FOLEY BAG SILVER LF 14FR (SET/KITS/TRAYS/PACK) ×3 IMPLANT

## 2017-06-21 NOTE — Op Note (Signed)
  Preoperative diagnosis: Term pregnancy, repeat cesarean section, request sterilization, chronic hypertension  Postoperative diagnosis: Same  Procedure: Repeat low transverse cesarean section, Filshie clip tubal ligation  Surgeon: Marcelle OverlieHolland  Final  EBL: 700 cc  Procedure and findings:  The patient was taken to the operating room and an adequate level of spinal anesthetic was obtained the patient was prepped and draped and a Foley catheter was positioned.  Appropriate timeouts were taken at that point.  Transverse incision made excising the old scar this is carried down to the fascia which was incised and extended transversely.  Rectus muscles divided in the midline, peritoneum entered superiorly without incident and extended in a vertical fashion.  The vesicouterine serosa was then incised and the bladder was bluntly and sharply dissected below, bladder blade position.  Transverse incision made in the lower segment extended with bandage scissors clear fluid noted infant delivered from the vertex presentation easily, vigorous female, infant was suctioned, cord clamped and passed to the pediatric team for further care.  Placenta was then delivered manually intact, uterus exteriorized, cavity wiped clean with laparotomy pack closure obtained with the first layer of 0 chromic in a locked fashion followed by McCain labral chromic.  This was hemostatic.  Bilateral tubes and ovaries were normal per her request, Filshie clip tubal was carried out applying Filshie clip the right ankle 3 cm from the cornu on each side with excellent application.  Reinspection of the operative site revealed to be hemostatic the bladder flap area was intact clear urine noted at that point.  Prior to closure sponge, needle, instrument counts reported as correct x2.  Peritoneum closed with a 3-0 Vicryl running suture the same on the rectus muscles in the midline, 0 PDS to close the fascia transversely the subcutaneous tissue was  undermined to reduce tissue that was this was rendered hemostatic with the Bovie was not closed separately with 4-0 Monocryl subcuticular closure with pressure dressing.  She tolerated this well went to recovery room in good condition.  Dictated with Dragon Medical 1  Meriel Picaichard M Nieves Chapa MD

## 2017-06-21 NOTE — Anesthesia Procedure Notes (Signed)
Spinal  Patient location during procedure: OR Start time: 06/21/2017 7:20 AM End time: 06/21/2017 7:24 AM Staffing Anesthesiologist: Leilani AbleHatchett, Taran Hable, MD Performed: anesthesiologist  Preanesthetic Checklist Completed: patient identified, surgical consent, pre-op evaluation, timeout performed, IV checked, risks and benefits discussed and monitors and equipment checked Spinal Block Patient position: sitting Prep: site prepped and draped and DuraPrep Patient monitoring: continuous pulse ox and blood pressure Approach: midline Location: L3-4 Injection technique: single-shot Needle Needle type: Pencan  Needle gauge: 24 G Needle length: 10 cm Needle insertion depth: 5 cm Assessment Sensory level: T6

## 2017-06-21 NOTE — Transfer of Care (Signed)
Immediate Anesthesia Transfer of Care Note  Patient: Anita Hall  Procedure(s) Performed: CESAREAN SECTION WITH BILATERAL TUBAL LIGATION (N/A Abdomen)  Patient Location: PACU  Anesthesia Type:Spinal  Level of Consciousness: awake and alert   Airway & Oxygen Therapy: Patient Spontanous Breathing  Post-op Assessment: Report given to RN  Post vital signs: Reviewed and stable  Last Vitals:  Vitals:   06/21/17 0600  BP: 140/89  Pulse: 77  Resp: 18  Temp: 36.6 C    Last Pain:  Vitals:   06/21/17 0600  TempSrc: Oral         Complications: No apparent anesthesia complications

## 2017-06-21 NOTE — Progress Notes (Signed)
Discussed BTL @ pre op exam, no other changes inHx or exxam

## 2017-06-21 NOTE — Lactation Note (Signed)
This note was copied from a baby's chart. Lactation Consultation Note  Patient Name: Anita Hall UXLKG'MToday's Date: 06/21/2017 Reason for consult: Initial assessment;Early term 37-38.6wks Breastfeeding consultation services and support information given and reviewed.  Newborn is 6 hours old and initially had a low blood sugar.  Baby has been to breast twice, given glucose gel and 5 mls of breast milk.  Repeat blood sugar was normal.  Mom just hand expressed another 5 mls she plans on giving baby.  Instructed to feed with cues and call for assist prn.  Maternal Data Has patient been taught Hand Expression?: Yes Does the patient have breastfeeding experience prior to this delivery?: Yes  Feeding Feeding Type: Breast Milk Length of feed: 10 min  LATCH Score Latch: Grasps breast easily, tongue down, lips flanged, rhythmical sucking.  Audible Swallowing: A few with stimulation  Type of Nipple: Everted at rest and after stimulation  Comfort (Breast/Nipple): Soft / non-tender  Hold (Positioning): Assistance needed to correctly position infant at breast and maintain latch.  LATCH Score: 8  Interventions    Lactation Tools Discussed/Used     Consult Status Consult Status: Follow-up Date: 06/22/17 Follow-up type: In-patient    Huston FoleyMOULDEN, Xzaviar Maloof S 06/21/2017, 1:57 PM

## 2017-06-21 NOTE — Anesthesia Preprocedure Evaluation (Signed)
Anesthesia Evaluation  Patient identified by MRN, date of birth, ID band Patient awake    Reviewed: Allergy & Precautions, H&P , NPO status , Patient's Chart, lab work & pertinent test results  Airway Mallampati: III  TM Distance: >3 FB Neck ROM: Full    Dental no notable dental hx. (+) Teeth Intact   Pulmonary neg pulmonary ROS, neg pneumonia ,    Pulmonary exam normal breath sounds clear to auscultation       Cardiovascular hypertension, Pt. on medications and Pt. on home beta blockers Normal cardiovascular exam Rhythm:Regular Rate:Normal     Neuro/Psych negative neurological ROS  negative psych ROS   GI/Hepatic GERD  Medicated and Controlled,  Endo/Other  diabetes, Well Controlled, Gestational, Oral Hypoglycemic Agents  Renal/GU   negative genitourinary   Musculoskeletal negative musculoskeletal ROS (+)   Abdominal Normal abdominal exam  (+)   Peds  Hematology   Anesthesia Other Findings   Reproductive/Obstetrics (+) Pregnancy SGA                             Anesthesia Physical  Anesthesia Plan  ASA: II  Anesthesia Plan: Spinal   Post-op Pain Management:    Induction:   PONV Risk Score and Plan: 3 and Ondansetron, Dexamethasone and Scopolamine patch - Pre-op  Airway Management Planned: Natural Airway and Nasal Cannula  Additional Equipment:   Intra-op Plan:   Post-operative Plan:   Informed Consent: I have reviewed the patients History and Physical, chart, labs and discussed the procedure including the risks, benefits and alternatives for the proposed anesthesia with the patient or authorized representative who has indicated his/her understanding and acceptance.   Dental advisory given  Plan Discussed with: CRNA and Surgeon  Anesthesia Plan Comments:         Anesthesia Quick Evaluation

## 2017-06-21 NOTE — Consult Note (Signed)
Neonatology Note:   Attendance at C-section:    I was asked by Dr. Marcelle OverlieHolland to attend this repeat C/S at term. The mother is a [id number] at 7637 5/[redacted] weeks EGA, GBS not done with good prenatal care. No verbally reported maternal history of complications. ROM 0 hours before delivery, fluid clear.  +30 sec DCC.   Infant vigorous with good spontaneous cry and tone. Needed only minimal bulb suctioning. Ap 8/9. Lungs clear to ausc in DR. To CN to care of Pediatrician.  Anita Kidavid C. Leary RocaEhrmann, MD

## 2017-06-21 NOTE — Addendum Note (Signed)
Addendum  created 06/21/17 1443 by Shanon PayorGregory, Kailyn Dubie M, CRNA   Sign clinical note

## 2017-06-21 NOTE — Anesthesia Postprocedure Evaluation (Signed)
Anesthesia Post Note  Patient: Joesphine BareSarah E Whittinghill  Procedure(s) Performed: CESAREAN SECTION WITH BILATERAL TUBAL LIGATION (N/A Abdomen)     Patient location during evaluation: Mother Baby Anesthesia Type: Spinal Level of consciousness: awake and alert and oriented Pain management: satisfactory to patient Vital Signs Assessment: post-procedure vital signs reviewed and stable Respiratory status: spontaneous breathing and nonlabored ventilation Cardiovascular status: stable Postop Assessment: no headache, no backache, patient able to bend at knees, no signs of nausea or vomiting and adequate PO intake Anesthetic complications: no    Last Vitals:  Vitals:   06/21/17 1300 06/21/17 1400  BP: 131/71 124/69  Pulse: 97 71  Resp: 20 20  Temp:  36.7 C  SpO2: 98% 98%    Last Pain:  Vitals:   06/21/17 1400  TempSrc: Oral  PainSc: 0-No pain   Pain Goal: Patients Stated Pain Goal: 3 (06/21/17 1000)               Madison HickmanGREGORY,Getsemani Lindon

## 2017-06-21 NOTE — Addendum Note (Signed)
Addendum  created 06/21/17 1219 by Leilani AbleHatchett, Rhylan Gross, MD   Order list changed, Order sets accessed

## 2017-06-21 NOTE — Anesthesia Postprocedure Evaluation (Signed)
Anesthesia Post Note  Patient: Anita Hall  Procedure(s) Performed: CESAREAN SECTION WITH BILATERAL TUBAL LIGATION (N/A Abdomen)     Patient location during evaluation: PACU Anesthesia Type: Spinal Level of consciousness: awake Pain management: pain level controlled Vital Signs Assessment: post-procedure vital signs reviewed and stable Respiratory status: spontaneous breathing Cardiovascular status: stable Postop Assessment: no headache, no backache, spinal receding, no apparent nausea or vomiting and patient able to bend at knees Anesthetic complications: no    Last Vitals:  Vitals:   06/21/17 0855 06/21/17 0955  BP:  138/72  Pulse:  (!) 55  Resp:    Temp: (!) 36.4 C 36.7 C  SpO2:  97%    Last Pain:  Vitals:   06/21/17 0955  TempSrc: Axillary   Pain Goal:                 Sully Dyment JR,JOHN Weslie Pretlow

## 2017-06-22 ENCOUNTER — Encounter (HOSPITAL_COMMUNITY): Payer: Self-pay | Admitting: Obstetrics and Gynecology

## 2017-06-22 LAB — BIRTH TISSUE RECOVERY COLLECTION (PLACENTA DONATION)

## 2017-06-22 LAB — CBC
HCT: 30.5 % — ABNORMAL LOW (ref 36.0–46.0)
Hemoglobin: 10.6 g/dL — ABNORMAL LOW (ref 12.0–15.0)
MCH: 32 pg (ref 26.0–34.0)
MCHC: 34.8 g/dL (ref 30.0–36.0)
MCV: 92.1 fL (ref 78.0–100.0)
PLATELETS: 188 10*3/uL (ref 150–400)
RBC: 3.31 MIL/uL — ABNORMAL LOW (ref 3.87–5.11)
RDW: 13.5 % (ref 11.5–15.5)
WBC: 13.8 10*3/uL — ABNORMAL HIGH (ref 4.0–10.5)

## 2017-06-22 MED ORDER — OXYCODONE HCL 5 MG/5ML PO SOLN: 5.0000 mg | ORAL | Status: DC | PRN

## 2017-06-22 MED ORDER — ACETAMINOPHEN 160 MG/5ML PO SOLN
650.0000 mg | ORAL | Status: DC | PRN
  Administered 2017-06-22 – 2017-06-23 (×2): 650 mg via ORAL
  Filled 2017-06-22: qty 20.3

## 2017-06-22 MED ORDER — ACETAMINOPHEN 160 MG/5ML PO SOLN
325.0000 mg | ORAL | Status: DC | PRN
  Filled 2017-06-22: qty 20.3

## 2017-06-22 MED ORDER — OXYCODONE HCL 5 MG/5ML PO SOLN
10.0000 mg | ORAL | Status: DC | PRN
  Administered 2017-06-22 – 2017-06-23 (×3): 10 mg via ORAL
  Filled 2017-06-22 (×3): qty 10

## 2017-06-22 NOTE — Progress Notes (Signed)
Subjective: Postpartum Day 1: Cesarean Delivery Patient reports tolerating PO.    Objective: Vital signs in last 24 hours: Temp:  [94.5 F (34.7 C)-99.1 F (37.3 C)] 98.3 F (36.8 C) (01/19 0455) Pulse Rate:  [55-97] 71 (01/19 0455) Resp:  [9-27] 18 (01/19 0455) BP: (117-138)/(60-78) 119/72 (01/19 0455) SpO2:  [94 %-99 %] 96 % (01/19 0455)  Physical Exam:  General: alert, cooperative and no distress Lochia: appropriate Uterine Fundus: firm Incision: healing well DVT Evaluation: No evidence of DVT seen on physical exam.  Recent Labs    06/20/17 0955 06/22/17 0538  HGB 12.6 10.6*  HCT 36.7 30.5*    Assessment/Plan: Status post Cesarean section. Doing well postoperatively.  Continue current care. D/W circumcision and risks. Baby has had dusky spells-D/W pediatrician-will hold circ Roselle LocusJames E Tip Atienza II 06/22/2017, 8:20 AM

## 2017-06-22 NOTE — Lactation Note (Signed)
This note was copied from a baby's chart. Lactation Consultation Note  Patient Name: Anita Hall [id number]'s Date: 06/22/2017  Mom states feedings are going well.  No concerns or questions.  Encouraged to call for assist prn.   Maternal Data    Feeding Length of feed: 10 min  LATCH Score                   Interventions    Lactation Tools Discussed/Used     Consult Status      Huston FoleyMOULDEN, Renarda Mullinix S 06/22/2017, 3:49 PM

## 2017-06-22 NOTE — Progress Notes (Addendum)
RN called Dr. Henderson Cloudomblin regarding the patient's honeycomb dressing which was saturated with blood. Dr. Henderson Cloudomblin ordered a dressing changed with a honeycomb and a pressure dressing.

## 2017-06-23 MED ORDER — ACETAMINOPHEN 325 MG PO TABS: 650.0000 mg | ORAL_TABLET | Freq: Four times a day (QID) | ORAL | 0 refills | Status: AC | PRN

## 2017-06-23 MED ORDER — OXYCODONE HCL 5 MG PO TABS: 5.0000 mg | ORAL_TABLET | Freq: Four times a day (QID) | ORAL | 0 refills | Status: AC | PRN

## 2017-06-23 MED ORDER — IBUPROFEN 600 MG PO TABS: 600.0000 mg | ORAL_TABLET | Freq: Four times a day (QID) | ORAL | 0 refills | Status: AC | PRN

## 2017-06-23 NOTE — Progress Notes (Signed)
Subjective: Postpartum Day 2: Cesarean Delivery Patient reports tolerating PO, + flatus and no problems voiding.    Objective: Vital signs in last 24 hours: Temp:  [97.8 F (36.6 C)-99 F (37.2 C)] 97.8 F (36.6 C) (01/20 16100633) Pulse Rate:  [74-84] 84 (01/20 0633) Resp:  [18] 18 (01/20 [id number]) BP: (133-148)/(67-85) 133/68 (01/20 0633) SpO2:  [97 %] 97 % (01/19 0849)  Physical Exam:  General: alert, cooperative and no distress Lochia: appropriate Uterine Fundus: firm Incision: healing well DVT Evaluation: No evidence of DVT seen on physical exam.  Recent Labs    06/20/17 0955 06/22/17 0538  HGB 12.6 10.6*  HCT 36.7 30.5*    Assessment/Plan: Status post Cesarean section. Doing well postoperatively.  Discharge home with standard precautions and return to clinic in 4-6 weeks.  Roselle LocusJames E Aaralynn Shepheard II 06/23/2017, 7:55 AM

## 2017-06-23 NOTE — Progress Notes (Signed)
Reviewed with patient and husband circumcision for baby this am

## 2017-06-23 NOTE — Lactation Note (Signed)
This note was copied from a baby's chart. Lactation Consultation Note  Patient Name: Anita Benedict NeedySarah Boyd ZOXWR'UToday's Date: 06/23/2017 Reason for consult: Follow-up assessment;Infant < 6lbs;Early term 37-38.6wks;Infant weight loss   Follow up with mom of 51 hour old infant. Infant with 9 BF for 10-30 minutes, 1 BF attempt, EBM x 3 via curved tip syringe of 5-10 cc, 2 voids and 7 stools in the last 24 hours. Infant weight 5 lb 12.8 oz with weight loss of 8% since birth. LATCH Scores 8.   Mom reports she feels infant is feeling well. Mom reports she has started pumping with her Hygia pump and is following with hand expression, mom is able to get colostrum to feed infant. Enc mom to continue to offer the breast 8-12 x in 24 hours with no longer that 3 hours between feeds due to size. Enc mom to continue to pump and hand express and supplement infant with EBM via syringe/finger. Mom voiced understanding.   Reviewed I/O, sign of dehydration in the infant, engorgement prevention/treatment, and breast milk storage and expression. [facility], mom aware of OP services, BF Support Groups and LC phone #. Mom does not wish to schedule and OP appt at this time. She will call to schedule if needed.   Mom reports her breasts are feeling fuller today. She denies pain/pinching with feedings. [facility] need for [facility] assistance with feeding at this time. Enc mom to call with any questions/concerns as needed and to call out for assistance as needed before d/c.      Maternal Data Formula Feeding for Exclusion: No Has patient been taught Hand Expression?: Yes Does the patient have breastfeeding experience prior to this delivery?: Yes  Feeding Feeding Type: Breast Milk  LATCH Score Latch: Grasps breast easily, tongue down, lips flanged, rhythmical sucking.  Audible Swallowing: Spontaneous and intermittent  Type of Nipple: Everted at rest and after stimulation  Comfort (Breast/Nipple): Filling, red/small  blisters or bruises, mild/mod discomfort  Hold (Positioning): Assistance needed to correctly position infant at breast and maintain latch.  LATCH Score: 8  Interventions Interventions: Breast feeding basics reviewed;Skin to skin;Breast massage;Breast compression;Expressed milk  Lactation Tools Discussed/Used Pump Review: Setup, frequency, and cleaning;Milk Storage Initiated by:: Reviewed and encouraged about every 2-3 hours post BF to supplement infant   Consult Status Consult Status: Complete Follow-up type: Call as needed    Ed BlalockSharon S Trinia Georgi 06/23/2017, 11:41 AM

## 2017-06-24 NOTE — Discharge Summary (Signed)
Obstetric Discharge Summary Reason for Admission: cesarean section Prenatal Procedures: none Intrapartum Procedures: cesarean: low cervical, transverse and tubal ligation Postpartum Procedures: none Complications-Operative and Postpartum: none Hemoglobin  Date Value Ref Range Status  06/22/2017 10.6 (L) 12.0 - 15.0 g/dL Final   HCT  Date Value Ref Range Status  06/22/2017 30.5 (L) 36.0 - 46.0 % Final    Physical Exam:  General: alert, cooperative and no distress Lochia: appropriate Uterine Fundus: firm Incision: healing well DVT Evaluation: No evidence of DVT seen on physical exam.  Discharge Diagnoses: Term Pregnancy-delivered  Discharge Information: Date: 06/24/2017 Activity: pelvic rest Diet: routine Medications: PNV and oxycodone Condition: stable Instructions: refer to practice specific booklet Discharge to: home   Newborn Data: Live born female  Birth Weight: 6 lb 4.5 oz (2850 g) APGAR: 8, 9  Newborn Delivery   Birth date/time:  06/[birth year]/2017 07:48:00 Delivery type:  C-Section, Low Transverse C-section categorization:  Repeat     Home with mother.  Roselle LocusJames E Susane Bey II 06/24/2017, 7:38 AM

## 2017-08-05 DIAGNOSIS — Z1389 Encounter for screening for other disorder: Secondary | ICD-10-CM | POA: Diagnosis not present

## 2018-01-14 DIAGNOSIS — R079 Chest pain, unspecified: Secondary | ICD-10-CM | POA: Diagnosis not present

## 2018-01-14 DIAGNOSIS — I10 Essential (primary) hypertension: Secondary | ICD-10-CM | POA: Diagnosis not present

## 2018-01-14 DIAGNOSIS — R748 Abnormal levels of other serum enzymes: Secondary | ICD-10-CM | POA: Diagnosis not present

## 2018-01-14 DIAGNOSIS — Z0000 Encounter for general adult medical examination without abnormal findings: Secondary | ICD-10-CM | POA: Diagnosis not present

## 2018-03-24 DIAGNOSIS — Z23 Encounter for immunization: Secondary | ICD-10-CM | POA: Diagnosis not present

## 2018-06-19 DIAGNOSIS — H938X1 Other specified disorders of right ear: Secondary | ICD-10-CM | POA: Diagnosis not present

## 2018-06-19 DIAGNOSIS — I10 Essential (primary) hypertension: Secondary | ICD-10-CM | POA: Diagnosis not present

## 2018-06-19 DIAGNOSIS — R748 Abnormal levels of other serum enzymes: Secondary | ICD-10-CM | POA: Diagnosis not present
